# Patient Record
Sex: Female | Born: 2000 | Race: Black or African American | Hispanic: No | Marital: Single | Smoking: Never smoker
Health system: Southern US, Community
[De-identification: ages and names within clinical notes are randomized; demographics above are authoritative.]

## PROBLEM LIST (undated history)

## (undated) VITALS — BP 88/53 | HR 114 | Temp 98.4°F | Resp 16 | Ht 58.66 in | Wt 77.4 lb

## (undated) DIAGNOSIS — F329 Major depressive disorder, single episode, unspecified: Secondary | ICD-10-CM

## (undated) DIAGNOSIS — F909 Attention-deficit hyperactivity disorder, unspecified type: Secondary | ICD-10-CM

## (undated) DIAGNOSIS — O139 Gestational [pregnancy-induced] hypertension without significant proteinuria, unspecified trimester: Secondary | ICD-10-CM

## (undated) DIAGNOSIS — F419 Anxiety disorder, unspecified: Secondary | ICD-10-CM

## (undated) DIAGNOSIS — F32A Depression, unspecified: Secondary | ICD-10-CM

## (undated) HISTORY — DX: Gestational (pregnancy-induced) hypertension without significant proteinuria, unspecified trimester: O13.9

## (undated) HISTORY — PX: WISDOM TOOTH EXTRACTION: SHX21

## (undated) HISTORY — PX: FRACTURE SURGERY: SHX138

---
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
## 2021-06-05 ENCOUNTER — Inpatient Hospital Stay (HOSPITAL_COMMUNITY)
Admission: AD | Admit: 2021-06-05 | Discharge: 2021-06-05 | Disposition: A | Discharge status: Home / Self Care | Payer: No Typology Code available for payment source | Attending: Obstetrics and Gynecology | Admitting: Obstetrics and Gynecology

## 2021-06-05 ENCOUNTER — Other Ambulatory Visit: Payer: Self-pay

## 2021-06-05 ENCOUNTER — Encounter (HOSPITAL_COMMUNITY): Payer: Self-pay | Admitting: Obstetrics and Gynecology

## 2021-06-05 DIAGNOSIS — Z3A12 12 weeks gestation of pregnancy: Secondary | ICD-10-CM | POA: Diagnosis not present

## 2021-06-05 DIAGNOSIS — O26891 Other specified pregnancy related conditions, first trimester: Secondary | ICD-10-CM | POA: Diagnosis not present

## 2021-06-05 DIAGNOSIS — R55 Syncope and collapse: Secondary | ICD-10-CM | POA: Diagnosis not present

## 2021-06-05 DIAGNOSIS — R42 Dizziness and giddiness: Secondary | ICD-10-CM | POA: Diagnosis not present

## 2021-06-05 DIAGNOSIS — O26892 Other specified pregnancy related conditions, second trimester: Secondary | ICD-10-CM | POA: Diagnosis not present

## 2021-06-05 DIAGNOSIS — R102 Pelvic and perineal pain: Secondary | ICD-10-CM | POA: Insufficient documentation

## 2021-06-05 LAB — BASIC METABOLIC PANEL
Anion gap: 7 (ref 5–15)
BUN: 7 mg/dL (ref 6–20)
CO2: 22 mmol/L (ref 22–32)
Calcium: 8.9 mg/dL (ref 8.9–10.3)
Chloride: 105 mmol/L (ref 98–111)
Creatinine, Ser: 0.64 mg/dL (ref 0.44–1.00)
GFR, Estimated: >60 mL/min (ref >60)
Glucose, Bld: 81 mg/dL (ref 70–99)
Potassium: 4 mmol/L (ref 3.5–5.1)
Sodium: 134 mmol/L — ABNORMAL LOW (ref 135–145)

## 2021-06-05 LAB — CBC
HCT: 33.7 % — ABNORMAL LOW (ref 36.0–46.0)
Hemoglobin: 11.8 g/dL — ABNORMAL LOW (ref 12.0–15.0)
MCH: 31.1 pg (ref 26.0–34.0)
MCHC: 35 g/dL (ref 30.0–36.0)
MCV: 88.7 fL (ref 80.0–100.0)
Platelets: 150 10*3/uL (ref 150–400)
RBC: 3.8 MIL/uL — ABNORMAL LOW (ref 3.87–5.11)
RDW: 13.1 % (ref 11.5–15.5)
WBC: 9.6 10*3/uL (ref 4.0–10.5)
nRBC: 0 % (ref 0.0–0.2)

## 2021-06-05 LAB — WET PREP, GENITAL
Clue Cells Wet Prep HPF POC: NONE SEEN
Sperm: NONE SEEN
Trich, Wet Prep: NONE SEEN
Yeast Wet Prep HPF POC: NONE SEEN

## 2021-06-05 LAB — URINALYSIS, ROUTINE W REFLEX MICROSCOPIC
Bilirubin Urine: NEGATIVE
Glucose, UA: NEGATIVE mg/dL
Hgb urine dipstick: NEGATIVE
Ketones, ur: NEGATIVE mg/dL
Leukocytes,Ua: NEGATIVE
Nitrite: NEGATIVE
Protein, ur: NEGATIVE mg/dL
Specific Gravity, Urine: 1.021 (ref 1.005–1.030)
pH: 6 (ref 5.0–8.0)

## 2021-06-05 NOTE — MAU Provider Note (Signed)
History   CSN: [id number]  Arrival date and time: 06/05/21 1354  Chief Complaint  Patient presents with   Abdominal Pain   HPI  Yesenia Clarke is a 20 year old G1P0 at [redacted]w[redacted]d by LMP who presents with pelvic pain/pressure that started earlier this morning. Patient reports it was initially quite intense but has improved over the course of the day. It is located across her lower abdomen. She denies dysuria, hematuria, vaginal bleeding, vaginal discharge, or loss of fluid. She has not had any fever or chills. She does state that she has been getting more dizzy after standing up since becoming pregnant. She states that she will get up to go walking somewhere and suddenly feel like she is going to pass out. She is drinking fluids and eating regularly, at least every 3-4 hours. She has not had associated palpitations, chest pain, or SOB associated with these episodes. She has not yet had her initial prenatal visit yet. She is planning to be seen at [facility]. She presents today for further evaluation.   OB History     Gravida  1   Para      Term      Preterm      AB      Living         SAB      IAB      Ectopic      Multiple      Live Births              Past Medical History:  Diagnosis Date   ADHD (attention deficit hyperactivity disorder)    Anxiety    Depression     Past Surgical History:  Procedure Laterality Date   FRACTURE SURGERY     WISDOM TOOTH EXTRACTION      Family History  Problem Relation Age of Onset   Drug abuse Father    Alcoholism Father    Hypertension Maternal Grandmother     Social History   Tobacco Use   Smoking status: Never   Smokeless tobacco: Never  Vaping Use   Vaping Use: Never used  Substance Use Topics   Alcohol use: No   Drug use: No    Allergies: No Known Allergies  No medications prior to admission.    Review of Systems  All other systems reviewed and are negative. See HPI.  Physical Exam   Blood pressure  112/60, pulse 79, temperature 98.3 F (36.8 C), temperature source Oral, resp. rate 16, height 5\' 5"  (1.651 m), weight 102.4 kg, last menstrual period 02/26/2021, SpO2 100 %.  Physical Exam Exam conducted with a chaperone present.  Constitutional:      General: She is not in acute distress.    Appearance: She is well-developed.  HENT:     Head: Normocephalic and atraumatic.     Mouth/Throat:     Mouth: Mucous membranes are moist.  Eyes:     Extraocular Movements: Extraocular movements intact.  Cardiovascular:     Rate and Rhythm: Normal rate and regular rhythm.     Heart sounds: No murmur heard. Pulmonary:     Effort: Pulmonary effort is normal.     Breath sounds: Normal breath sounds.  Abdominal:     General: Bowel sounds are normal.     Palpations: Abdomen is soft.     Tenderness: There is no abdominal tenderness.  Genitourinary:    Vagina: Vaginal discharge (white colored discharge in vaginal vault) present.  Comments: Cervical exam: 0/0/-3 Neurological:     Mental Status: She is alert.   FHT - 154 bpm  MAU Course   MDM Patient presents to MAU with two complaints today. She reports pelvic pain and pressure that started today. She has not had associated vaginal discharge or dysuria. No vaginal bleeding or LOF.  She additionally reports feeling lightheaded with standing up. This has been going on since the onset of her pregnancy and has not been associated with palpitations, chest pain, or SOB. It improves on its own. She is hemodynamically stable.   A sterile speculum exam was performed and showed white vaginal discharge in the vaginal vault - otherwise unremarkable. Patient's cervix is closed by digital exam. No CMT.   Her urinalysis was normal - OB culture sent.   Wet prep - negative; GC/CT pending.   CBC with Hgb 11.8 - otherwise normal. BMP unremarkable.  EKG obtained and personally reviewed. Normal sinus rhythm without ischemic changes or obvious arrhythmia.  Discussed EKG with Cardiology on call due to concern for possible delta wave. EKG reviewed by Dr. Mayford Knife and confirmed absence of WPW.   1800 - Symptoms improved. No longer complaining of pain/pressure in the pelvic region.  Overall stable for discharge home. Will await pending labs and treat as indicated. Reviewed strict return precautions. Patient and her mother voiced understanding. Encouraged increased hydration due to lightheadedness with positional changes that is likely due to mild orthostasis. Return precautions reviewed should she ever experience true syncope or other associated cardiac symptoms. Both patient and parent voiced understanding.  Assessment and Plan  Clinical impression - Pelvic pressure in pregnancy, second trimester Discharge home to self care   Evalina Field, MD Obstetrics Fellow 06/06/2021, 9:07 PM

## 2021-06-05 NOTE — MAU Note (Signed)
Called Main Lab to check on status of BMP. After being placed on hold, Lab took down unit number and stated they would search for the sample and call back.

## 2021-06-05 NOTE — MAU Note (Addendum)
Been having pain and pressure in lower abd/pelvis. Denies bleeding.  D/c is heavier since pregnant- denies itching or irritation. Preg confirmed at [facility]  early June.- switched care

## 2021-06-05 NOTE — Progress Notes (Signed)
   ECG reviewed by Dr Mayford Knife, no WPW or other acute problem.  Theodore Demark, PA-C 06/05/2021 8:22 PM

## 2021-06-06 ENCOUNTER — Encounter: Payer: Self-pay | Admitting: Obstetrics

## 2021-06-06 LAB — GC/CHLAMYDIA PROBE AMP (~~LOC~~) NOT AT ARMC
Chlamydia: NEGATIVE
Comment: NEGATIVE
Comment: NORMAL
Neisseria Gonorrhea: NEGATIVE

## 2021-06-07 LAB — CULTURE, OB URINE: Culture: <10000 — AB

## 2021-06-11 ENCOUNTER — Telehealth: Payer: Self-pay | Admitting: *Deleted

## 2021-06-11 ENCOUNTER — Ambulatory Visit: Payer: Medicaid Other | Admitting: *Deleted

## 2021-06-11 DIAGNOSIS — Z3A15 15 weeks gestation of pregnancy: Secondary | ICD-10-CM

## 2021-06-18 ENCOUNTER — Other Ambulatory Visit: Payer: Self-pay

## 2021-06-18 ENCOUNTER — Ambulatory Visit (INDEPENDENT_AMBULATORY_CARE_PROVIDER_SITE_OTHER): Payer: Medicaid Other | Admitting: Obstetrics

## 2021-06-18 ENCOUNTER — Encounter: Payer: Self-pay | Admitting: Obstetrics

## 2021-06-18 VITALS — BP 128/71 | HR 98 | Wt 182.0 lb

## 2021-06-18 DIAGNOSIS — Z3480 Encounter for supervision of other normal pregnancy, unspecified trimester: Secondary | ICD-10-CM

## 2021-06-18 MED ORDER — VITAFOL ULTRA 29-0.6-0.4-200 MG PO CAPS
1.0000 | ORAL_CAPSULE | Freq: Every day | ORAL | 4 refills | Status: DC
Start: 2021-06-18 — End: 2022-04-15

## 2021-06-18 NOTE — Telephone Encounter (Signed)
No answer for OB Intake call on 06/11/21.

## 2021-06-18 NOTE — Progress Notes (Signed)
Subjective:    Yesenia Clarke is being seen today for her first obstetrical visit.  This is not a planned pregnancy. She is at [redacted]w[redacted]d gestation. Her obstetrical history is significant for  none . Relationship with FOB: significant other, not living together. Patient does intend to breast feed. Pregnancy history fully reviewed.  The information documented in the HPI was reviewed and verified.  Menstrual History: OB History     Gravida  1   Para      Term      Preterm      AB      Living         SAB      IAB      Ectopic      Multiple      Live Births               Patient's last menstrual period was 02/26/2021.    Past Medical History:  Diagnosis Date   ADHD (attention deficit hyperactivity disorder)    Anxiety    Depression     Past Surgical History:  Procedure Laterality Date   FRACTURE SURGERY     WISDOM TOOTH EXTRACTION      (Not in a hospital admission)  No Known Allergies  Social History   Tobacco Use   Smoking status: Never   Smokeless tobacco: Never  Substance Use Topics   Alcohol use: No    Family History  Problem Relation Age of Onset   Drug abuse Father    Alcoholism Father    Hypertension Maternal Grandmother      Review of Systems Constitutional: negative for weight loss Gastrointestinal: negative for vomiting Genitourinary:negative for genital lesions and vaginal discharge and dysuria Musculoskeletal:negative for back pain Behavioral/Psych: negative for abusive relationship, depression, illegal drug usage and tobacco use    Objective:    BP 128/71   Pulse 98   Wt 182 lb (82.6 kg)   LMP 02/26/2021   BMI 30.29 kg/m  General Appearance:    Alert, cooperative, no distress, appears stated age  Head:    Normocephalic, without obvious abnormality, atraumatic  Eyes:    PERRL, conjunctiva/corneas clear, EOM's intact, fundi    benign, both eyes  Ears:    Normal TM's and external ear canals, both ears  Nose:   Nares normal, septum  midline, mucosa normal, no drainage    or sinus tenderness  Throat:   Lips, mucosa, and tongue normal; teeth and gums normal  Neck:   Supple, symmetrical, trachea midline, no adenopathy;    thyroid:  no enlargement/tenderness/nodules; no carotid   bruit or JVD  Back:     Symmetric, no curvature, ROM normal, no CVA tenderness  Lungs:     Clear to auscultation bilaterally, respirations unlabored  Chest Wall:    No tenderness or deformity   Heart:    Regular rate and rhythm, S1 and S2 normal, no murmur, rub   or gallop  Breast Exam:    No tenderness, masses, or nipple abnormality  Abdomen:     Soft, non-tender, bowel sounds active all four quadrants,    no masses, no organomegaly  Genitalia:    Normal female without lesion, discharge or tenderness  Extremities:   Extremities normal, atraumatic, no cyanosis or edema  Pulses:   2+ and symmetric all extremities  Skin:   Skin color, texture, turgor normal, no rashes or lesions  Lymph nodes:   Cervical, supraclavicular, and axillary nodes normal  Neurologic:  CNII-XII intact, normal strength, sensation and reflexes    throughout      Lab Review Urine pregnancy test Labs reviewed yes Radiologic studies reviewed no  Assessment:    Pregnancy at [redacted]w[redacted]d weeks    Plan:    1. Supervision of other normal pregnancy, antepartum Rx: - [place] MFM OB COMP + 14 WK; Future - Obstetric Panel, Including HIV - Genetic Screening - Prenat-Fe Poly-Methfol-FA-DHA (VITAFOL ULTRA) 29-0.6-0.4-200 MG CAPS; Take 1 capsule by mouth daily before breakfast.  Dispense: 90 capsule; Refill: 4   Prenatal vitamins.  Counseling provided regarding continued use of seat belts, cessation of alcohol consumption, smoking or use of illicit drugs; infection precautions i.e., influenza/TDAP immunizations, toxoplasmosis,CMV, parvovirus, listeria and varicella; workplace safety, exercise during pregnancy; routine dental care, safe medications, sexual activity, hot tubs, saunas,  pools, travel, caffeine use, fish and methlymercury, potential toxins, hair treatments, varicose veins Weight gain recommendations per IOM guidelines reviewed: underweight/BMI< 18.5--> gain 28 - 40 lbs; normal weight/BMI 18.5 - 24.9--> gain 25 - 35 lbs; overweight/BMI 25 - 29.9--> gain 15 - 25 lbs; obese/BMI >30->gain  11 - 20 lbs Problem list reviewed and updated. FIRST/CF mutation testing/NIPT/QUAD SCREEN/fragile X/Ashkenazi Jewish population testing/Spinal muscular atrophy discussed: requested. Role of ultrasound in pregnancy discussed; fetal survey: requested. Amniocentesis discussed: not indicated.  Meds ordered this encounter  Medications   Prenat-Fe Poly-Methfol-FA-DHA (VITAFOL ULTRA) 29-0.6-0.4-200 MG CAPS    Sig: Take 1 capsule by mouth daily before breakfast.    Dispense:  90 capsule    Refill:  4   Orders Placed This Encounter  Procedures   [place] MFM OB COMP + 14 WK    Standing Status:   Future    Standing Expiration Date:   06/18/2022    Order Specific Question:   Reason for Exam (SYMPTOM  OR DIAGNOSIS REQUIRED)    Answer:   growth    Order Specific Question:   Preferred Location    Answer:   [facility]   Obstetric Panel, Including HIV   Genetic Screening    [facility]    Follow up in 4 weeks.  I have spent a total of 20 minutes of face-to-face time, excluding clinical staff time, reviewing notes and preparing to see patient, ordering tests and/or medications, and counseling the patient.    Brock Bad, MD 06/18/2021 10:50 AM

## 2021-06-20 LAB — OBSTETRIC PANEL, INCLUDING HIV
Antibody Screen: NEGATIVE
Basophils Absolute: 0 10*3/uL (ref 0.0–0.2)
Basos: 0 %
EOS (ABSOLUTE): 0 10*3/uL (ref 0.0–0.4)
Eos: 1 %
HIV Screen 4th Generation wRfx: NONREACTIVE
Hematocrit: 34 % (ref 34.0–46.6)
Hemoglobin: 11.7 g/dL (ref 11.1–15.9)
Hepatitis B Surface Ag: NEGATIVE
Immature Grans (Abs): 0.1 10*3/uL (ref 0.0–0.1)
Immature Granulocytes: 1 %
Lymphocytes Absolute: 1.3 10*3/uL (ref 0.7–3.1)
Lymphs: 15 %
MCH: 30.5 pg (ref 26.6–33.0)
MCHC: 34.4 g/dL (ref 31.5–35.7)
MCV: 89 fL (ref 79–97)
Monocytes Absolute: 0.5 10*3/uL (ref 0.1–0.9)
Monocytes: 6 %
Neutrophils Absolute: 6.6 10*3/uL (ref 1.4–7.0)
Neutrophils: 77 %
Platelets: 163 10*3/uL (ref 150–450)
RBC: 3.84 x10E6/uL (ref 3.77–5.28)
RDW: 12.8 % (ref 11.7–15.4)
RPR Ser Ql: NONREACTIVE
Rh Factor: POSITIVE
Rubella Antibodies, IGG: 6.82 index (ref >0.99)
WBC: 8.5 10*3/uL (ref 3.4–10.8)

## 2021-06-21 ENCOUNTER — Inpatient Hospital Stay (HOSPITAL_COMMUNITY)
Admission: AD | Admit: 2021-06-21 | Discharge: 2021-06-21 | Disposition: A | Discharge status: Home / Self Care | Payer: No Typology Code available for payment source | Attending: Obstetrics and Gynecology | Admitting: Obstetrics and Gynecology

## 2021-06-21 ENCOUNTER — Encounter (HOSPITAL_COMMUNITY): Payer: Self-pay | Admitting: Obstetrics and Gynecology

## 2021-06-21 DIAGNOSIS — N949 Unspecified condition associated with female genital organs and menstrual cycle: Secondary | ICD-10-CM | POA: Diagnosis not present

## 2021-06-21 DIAGNOSIS — Z3A16 16 weeks gestation of pregnancy: Secondary | ICD-10-CM | POA: Insufficient documentation

## 2021-06-21 DIAGNOSIS — Z8249 Family history of ischemic heart disease and other diseases of the circulatory system: Secondary | ICD-10-CM | POA: Diagnosis not present

## 2021-06-21 DIAGNOSIS — O26892 Other specified pregnancy related conditions, second trimester: Secondary | ICD-10-CM | POA: Insufficient documentation

## 2021-06-21 DIAGNOSIS — O26899 Other specified pregnancy related conditions, unspecified trimester: Secondary | ICD-10-CM

## 2021-06-21 DIAGNOSIS — R1031 Right lower quadrant pain: Secondary | ICD-10-CM | POA: Diagnosis not present

## 2021-06-21 DIAGNOSIS — R102 Pelvic and perineal pain: Secondary | ICD-10-CM | POA: Diagnosis not present

## 2021-06-21 DIAGNOSIS — R1032 Left lower quadrant pain: Secondary | ICD-10-CM | POA: Insufficient documentation

## 2021-06-21 LAB — URINALYSIS, ROUTINE W REFLEX MICROSCOPIC
Bilirubin Urine: NEGATIVE
Glucose, UA: NEGATIVE mg/dL
Hgb urine dipstick: NEGATIVE
Ketones, ur: NEGATIVE mg/dL
Leukocytes,Ua: NEGATIVE
Nitrite: NEGATIVE
Protein, ur: NEGATIVE mg/dL
Specific Gravity, Urine: 1.006 (ref 1.005–1.030)
pH: 7 (ref 5.0–8.0)

## 2021-06-21 MED ORDER — IBUPROFEN 600 MG PO TABS
600.0000 mg | ORAL_TABLET | Freq: Once | ORAL | Status: AC
  Administered 2021-06-21: 600 mg via ORAL

## 2021-06-21 NOTE — MAU Note (Signed)
Reports she has been stressed today and began cramping, is worried about the baby. No vaginal bleeding.  FHR: 155 Pain score: 8/10

## 2021-06-21 NOTE — MAU Provider Note (Signed)
Chief Complaint:  No chief complaint on file.   Event Date/Time   First Provider Initiated Contact with Patient 06/21/21 0238     HPI: Yesenia Clarke is a 20 y.o. G1P0 at 24w3dwho presents to maternity admissions reporting painful cramping which woke her from sleep  States she has been stressed and wanted to make sure baby is ok. Catalina Pizza RN that pain was more sharp, intermittent on sides of uterus.  She denies LOF, vaginal bleeding, vaginal itching/burning, urinary symptoms, h/a, dizziness, n/v, diarrhea, constipation or fever/chills.  .  Abdominal Pain This is a new problem. The current episode started today. The onset quality is sudden. The problem occurs constantly. The problem is unchanged. The pain is located in the LLQ and RLQ. The quality of the pain is described as sharp and cramping. The pain does not radiate. Associated symptoms include anxiety. Pertinent negatives include no constipation, diarrhea, dysuria, fever, frequency, myalgias or nausea. Nothing relieves the symptoms. Past treatments include nothing.   RN Note: Reports she has been stressed today and began cramping, is worried about the baby. No vaginal bleeding.  FHR: 155 Pain score: 8/10  Past Medical History: Past Medical History:  Diagnosis Date   ADHD (attention deficit hyperactivity disorder)    Anxiety    Depression     Past obstetric history: OB History  Gravida Para Term Preterm AB Living  1            SAB IAB Ectopic Multiple Live Births               # Outcome Date GA Lbr Len/2nd Weight Sex Delivery Anes PTL Lv  1 Current             Past Surgical History: Past Surgical History:  Procedure Laterality Date   FRACTURE SURGERY     WISDOM TOOTH EXTRACTION      Family History: Family History  Problem Relation Age of Onset   Drug abuse Father    Alcoholism Father    Hypertension Maternal Grandmother     Social History: Social History   Tobacco Use   Smoking status: Never   Smokeless tobacco:  Never  Vaping Use   Vaping Use: Never used  Substance Use Topics   Alcohol use: No   Drug use: No    Allergies: No Known Allergies  Meds:  Medications Prior to Admission  Medication Sig Dispense Refill Last Dose   Prenat-Fe Poly-Methfol-FA-DHA (VITAFOL ULTRA) 29-0.6-0.4-200 MG CAPS Take 1 capsule by mouth daily before breakfast. 90 capsule 4    pyridOXINE (VITAMIN B-6) 100 MG tablet Take 100 mg by mouth daily.       I have reviewed patient's Past Medical Hx, Surgical Hx, Family Hx, Social Hx, medications and allergies.   ROS:  Review of Systems  Constitutional:  Negative for fever.  Gastrointestinal:  Positive for abdominal pain. Negative for constipation, diarrhea and nausea.  Genitourinary:  Negative for dysuria and frequency.  Musculoskeletal:  Negative for myalgias.  Psychiatric/Behavioral:  The patient is nervous/anxious.   Other systems negative  Physical Exam  No data found. Constitutional: Well-developed, well-nourished female in no acute distress.  Cardiovascular: normal rate and rhythm Respiratory: normal effort, clear to auscultation bilaterally GI: Abd soft, non-tender, gravid appropriate for gestational age.   No rebound or guarding. MS: Extremities nontender, no edema, normal ROM Neurologic: Alert and oriented x 4.  GU: Neg CVAT.  PELVIC EXAM:  Cervix long and closed FHT:  155   Labs: Results for  orders placed or performed during the hospital encounter of 06/21/21 (from the past 24 hour(s))  Urinalysis, Routine w reflex microscopic     Status: Abnormal   Collection Time: 06/21/21  1:31 AM  Result Value Ref Range   Color, Urine STRAW (A) YELLOW   APPearance CLEAR CLEAR   Specific Gravity, Urine 1.006 1.005 - 1.030   pH 7.0 5.0 - 8.0   Glucose, UA NEGATIVE NEGATIVE mg/dL   Hgb urine dipstick NEGATIVE NEGATIVE   Bilirubin Urine NEGATIVE NEGATIVE   Ketones, ur NEGATIVE NEGATIVE mg/dL   Protein, ur NEGATIVE NEGATIVE mg/dL   Nitrite NEGATIVE NEGATIVE    Leukocytes,Ua NEGATIVE NEGATIVE    B/Positive/-- (08/08 1110)  Imaging:  No results found.  MAU Course/MDM: I have ordered labs and reviewed results. UA is clear Treatments in MAU included ibuprofen which improved pain from an "8" to a "5" Discussed this may be cramping due to stretching/growth or round ligament pain  No indication of Preterm labor or change in cervix.    Assessment: Single IUP at.[redacted]w[redacted]d Pelvic pain, likely round ligament pain  Plan: Discharge home PTL precautions Follow up in Office for prenatal visits Encouraged to return if she develops worsening of symptoms, increase in pain, fever, or other concerning symptoms.   Pt stable at time of discharge.  Wynelle Bourgeois CNM, MSN Certified Nurse-Midwife 06/21/2021 2:38 AM

## 2021-07-02 ENCOUNTER — Encounter: Payer: Self-pay | Admitting: Obstetrics

## 2021-07-04 ENCOUNTER — Encounter: Payer: Self-pay | Admitting: Obstetrics

## 2021-07-10 ENCOUNTER — Encounter: Payer: Self-pay | Admitting: Obstetrics

## 2021-07-12 ENCOUNTER — Ambulatory Visit: Payer: No Typology Code available for payment source | Attending: Obstetrics

## 2021-07-12 ENCOUNTER — Other Ambulatory Visit: Payer: Self-pay

## 2021-07-12 DIAGNOSIS — Z3480 Encounter for supervision of other normal pregnancy, unspecified trimester: Secondary | ICD-10-CM

## 2021-07-17 ENCOUNTER — Encounter: Payer: Self-pay | Admitting: Obstetrics

## 2021-07-17 ENCOUNTER — Other Ambulatory Visit: Payer: Self-pay

## 2021-07-17 ENCOUNTER — Ambulatory Visit (INDEPENDENT_AMBULATORY_CARE_PROVIDER_SITE_OTHER): Payer: Medicaid Other | Admitting: Obstetrics

## 2021-07-17 VITALS — BP 127/78 | HR 107 | Wt 190.2 lb

## 2021-07-17 DIAGNOSIS — Z3480 Encounter for supervision of other normal pregnancy, unspecified trimester: Secondary | ICD-10-CM

## 2021-07-17 NOTE — Progress Notes (Signed)
Subjective:  Yesenia Clarke is a 20 y.o. G1P0 at [redacted]w[redacted]d being seen today for ongoing prenatal care.  She is currently monitored for the following issues for this low-risk pregnancy and has ADHD (attention deficit hyperactivity disorder), combined type and MDD (major depressive disorder), recurrent episode, severe (HCC) on their problem list.  Patient reports no complaints.  Contractions: Not present. Vag. Bleeding: None.  Movement: Present. Denies leaking of fluid.   The following portions of the patient's history were reviewed and updated as appropriate: allergies, current medications, past family history, past medical history, past social history, past surgical history and problem list. Problem list updated.  Objective:   Vitals:   07/17/21 0938  BP: 127/78  Pulse: (!) 107  Weight: 190 lb 3.2 oz (86.3 kg)    Fetal Status:     Movement: Present     General:  Alert, oriented and cooperative. Patient is in no acute distress.  Skin: Skin is warm and dry. No rash noted.   Cardiovascular: Normal heart rate noted  Respiratory: Normal respiratory effort, no problems with respiration noted  Abdomen: Soft, gravid, appropriate for gestational age. Pain/Pressure: Absent     Pelvic:  Cervical exam deferred        Extremities: Normal range of motion.  Edema: None  Mental Status: Normal mood and affect. Normal behavior. Normal judgment and thought content.   Urinalysis:      Assessment and Plan:  Pregnancy: G1P0 at [redacted]w[redacted]d  1. Supervision of other normal pregnancy, antepartum   Preterm labor symptoms and general obstetric precautions including but not limited to vaginal bleeding, contractions, leaking of fluid and fetal movement were reviewed in detail with the patient. Please refer to After Visit Summary for other counseling recommendations.   Return in about 4 weeks (around 08/14/2021) for ROB.   Brock Bad, MD  07/17/21

## 2021-07-17 NOTE — Progress Notes (Signed)
Patient presents for ROB. Patient has no concerns today. Patient declined AFP Open Spina Bifida

## 2021-08-14 ENCOUNTER — Ambulatory Visit (INDEPENDENT_AMBULATORY_CARE_PROVIDER_SITE_OTHER): Payer: Medicaid Other | Admitting: Family Medicine

## 2021-08-14 ENCOUNTER — Encounter: Payer: Self-pay | Admitting: Family Medicine

## 2021-08-14 ENCOUNTER — Other Ambulatory Visit: Payer: Self-pay

## 2021-08-14 VITALS — BP 130/79 | HR 74 | Wt 196.6 lb

## 2021-08-14 DIAGNOSIS — Z3A24 24 weeks gestation of pregnancy: Secondary | ICD-10-CM

## 2021-08-14 DIAGNOSIS — R030 Elevated blood-pressure reading, without diagnosis of hypertension: Secondary | ICD-10-CM

## 2021-08-14 DIAGNOSIS — Z3480 Encounter for supervision of other normal pregnancy, unspecified trimester: Secondary | ICD-10-CM

## 2021-08-14 DIAGNOSIS — Z3400 Encounter for supervision of normal first pregnancy, unspecified trimester: Secondary | ICD-10-CM | POA: Insufficient documentation

## 2021-08-14 DIAGNOSIS — Z3482 Encounter for supervision of other normal pregnancy, second trimester: Secondary | ICD-10-CM

## 2021-08-14 MED ORDER — ASPIRIN 81 MG PO CHEW: 81.0000 mg | CHEWABLE_TABLET | Freq: Every day | ORAL | 5 refills | Status: DC

## 2021-08-14 MED ORDER — BLOOD PRESSURE KIT DEVI: 1.0000 | 0 refills | Status: DC

## 2021-08-14 NOTE — Progress Notes (Signed)
   PRENATAL VISIT NOTE  Subjective:  Yesenia Clarke is a 20 y.o. G1P0 at [redacted]w[redacted]d being seen today for ongoing prenatal care.  She is currently monitored for the following issues for this low-risk pregnancy and has ADHD (attention deficit hyperactivity disorder), combined type; MDD (major depressive disorder), recurrent episode, severe ([place]); and Encounter for supervision of normal first pregnancy on their problem list.  Patient reports no complaints.  Contractions: Not present. Vag. Bleeding: None.  Movement: Present. Denies leaking of fluid.   The following portions of the patient's history were reviewed and updated as appropriate: allergies, current medications, past family history, past medical history, past social history, past surgical history and problem list.   Objective:   Vitals:   08/14/21 1043 08/14/21 1046 08/14/21 1156  BP: 139/78 (!) 142/73 130/79  Pulse: (!) 122 (!) 118 74  Weight: 196 lb 9.6 oz (89.2 kg)      Fetal Status: Fetal Heart Rate (bpm): 145   Movement: Present  General:  Alert, oriented and cooperative. Patient is in no acute distress.  Skin: Skin is warm and dry. No rash noted.   Cardiovascular: Normal heart rate noted.  Respiratory: Normal respiratory effort, no problems with respiration noted.  Abdomen: Soft, gravid, appropriate for gestational age.  Pain/Pressure: Absent.     Pelvic: Cervical exam deferred.        Extremities: Normal range of motion.  Edema: None.  Mental Status: Normal mood and affect. Normal behavior. Normal judgment and thought content.   Assessment and Plan:  Pregnancy: G1P0 at [redacted]w[redacted]d  1. Supervision of other normal pregnancy, antepartum 2. [redacted] weeks gestation of pregnancy Progressing well. No concerns today. FHT within normal limits. Follow up in 4 weeks for next prenatal visit. Will be due for 28 week labs and Tdap at that time.   3. Elevated BP without diagnosis of hypertension Systolic BP elevated this visit. Mild range. No prior hx  HTN. Asymptomatic. Repeat BP normal. Will obtain baseline CBC, CMP, and UPC as below. Will start ASA 81 mg due to nulliparity and sociodemographic characteristics (2 moderate risk factors). Reviewed spectrum of hypertensive disorders in pregnancy and expectations pending further evaluation. Advised patient to check her BP at home and reviewed return precautions. Will plan to follow up next visit.  - CBC - Comp Met (CMET) - Protein / creatinine ratio, urine - aspirin 81 MG chewable tablet; Chew 1 tablet (81 mg total) by mouth daily.  Dispense: 30 tablet; Refill: 5 - Blood Pressure Monitoring (BLOOD PRESSURE KIT) DEVI; 1 kit by Does not apply route once a week.  Dispense: 1 each; Refill: 0 - Babyscripts Schedule Optimization  Preterm labor symptoms and general obstetric precautions including but not limited to vaginal bleeding, contractions, leaking of fluid and fetal movement were reviewed in detail with the patient.  Please refer to After Visit Summary for other counseling recommendations.   Return in about 4 weeks (around 09/11/2021) for follow up [place] visit.  Genia Del, MD

## 2021-08-14 NOTE — Progress Notes (Signed)
Patient presents for ROB. Patient denies having any headaches, visual changes, or swelling.

## 2021-08-15 ENCOUNTER — Other Ambulatory Visit: Payer: Self-pay | Admitting: Family Medicine

## 2021-08-15 DIAGNOSIS — O99012 Anemia complicating pregnancy, second trimester: Secondary | ICD-10-CM

## 2021-08-15 LAB — COMPREHENSIVE METABOLIC PANEL
ALT: 23 IU/L (ref 0–32)
AST: 18 IU/L (ref 0–40)
Albumin/Globulin Ratio: 1.2 (ref 1.2–2.2)
Albumin: 3.7 g/dL — ABNORMAL LOW (ref 3.9–5.0)
Alkaline Phosphatase: 130 IU/L — ABNORMAL HIGH (ref 42–106)
BUN/Creatinine Ratio: 9 (ref 9–23)
BUN: 6 mg/dL (ref 6–20)
Bilirubin Total: 0.4 mg/dL (ref 0.0–1.2)
CO2: 18 mmol/L — ABNORMAL LOW (ref 20–29)
Calcium: 9.5 mg/dL (ref 8.7–10.2)
Chloride: 106 mmol/L (ref 96–106)
Creatinine, Ser: 0.67 mg/dL (ref 0.57–1.00)
Globulin, Total: 3.1 g/dL (ref 1.5–4.5)
Glucose: 86 mg/dL (ref 70–99)
Potassium: 4.1 mmol/L (ref 3.5–5.2)
Sodium: 141 mmol/L (ref 134–144)
Total Protein: 6.8 g/dL (ref 6.0–8.5)
eGFR: 129 mL/min/{1.73_m2} (ref >59)

## 2021-08-15 LAB — CBC
Hematocrit: 31.8 % — ABNORMAL LOW (ref 34.0–46.6)
Hemoglobin: 11 g/dL — ABNORMAL LOW (ref 11.1–15.9)
MCH: 30.1 pg (ref 26.6–33.0)
MCHC: 34.6 g/dL (ref 31.5–35.7)
MCV: 87 fL (ref 79–97)
Platelets: 180 10*3/uL (ref 150–450)
RBC: 3.65 x10E6/uL — ABNORMAL LOW (ref 3.77–5.28)
RDW: 12.3 % (ref 11.7–15.4)
WBC: 7.8 10*3/uL (ref 3.4–10.8)

## 2021-08-15 LAB — PROTEIN / CREATININE RATIO, URINE
Creatinine, Urine: 83.8 mg/dL
Protein, Ur: 9.7 mg/dL
Protein/Creat Ratio: 116 mg/g creat (ref 0–200)

## 2021-08-15 MED ORDER — IRON (FERROUS SULFATE) 325 (65 FE) MG PO TABS: 1.0000 | ORAL_TABLET | ORAL | 5 refills | Status: DC

## 2021-09-05 ENCOUNTER — Inpatient Hospital Stay (HOSPITAL_COMMUNITY)
Admission: AD | Admit: 2021-09-05 | Discharge: 2021-09-05 | Disposition: A | Discharge status: Home / Self Care | Payer: Medicaid Other | Attending: Obstetrics and Gynecology | Admitting: Obstetrics and Gynecology

## 2021-09-05 ENCOUNTER — Encounter (HOSPITAL_COMMUNITY): Payer: Self-pay | Admitting: Obstetrics and Gynecology

## 2021-09-05 ENCOUNTER — Other Ambulatory Visit: Payer: Self-pay

## 2021-09-05 DIAGNOSIS — Z7982 Long term (current) use of aspirin: Secondary | ICD-10-CM | POA: Insufficient documentation

## 2021-09-05 DIAGNOSIS — B9689 Other specified bacterial agents as the cause of diseases classified elsewhere: Secondary | ICD-10-CM

## 2021-09-05 DIAGNOSIS — Z3A27 27 weeks gestation of pregnancy: Secondary | ICD-10-CM | POA: Diagnosis not present

## 2021-09-05 DIAGNOSIS — O368120 Decreased fetal movements, second trimester, not applicable or unspecified: Secondary | ICD-10-CM | POA: Diagnosis not present

## 2021-09-05 DIAGNOSIS — Z3689 Encounter for other specified antenatal screening: Secondary | ICD-10-CM

## 2021-09-05 DIAGNOSIS — O23592 Infection of other part of genital tract in pregnancy, second trimester: Secondary | ICD-10-CM | POA: Insufficient documentation

## 2021-09-05 DIAGNOSIS — O26892 Other specified pregnancy related conditions, second trimester: Secondary | ICD-10-CM | POA: Diagnosis not present

## 2021-09-05 DIAGNOSIS — N760 Acute vaginitis: Secondary | ICD-10-CM

## 2021-09-05 LAB — WET PREP, GENITAL
Sperm: NONE SEEN
Trich, Wet Prep: NONE SEEN
Yeast Wet Prep HPF POC: NONE SEEN

## 2021-09-05 LAB — URINALYSIS, ROUTINE W REFLEX MICROSCOPIC
Bilirubin Urine: NEGATIVE
Glucose, UA: NEGATIVE mg/dL
Hgb urine dipstick: NEGATIVE
Ketones, ur: NEGATIVE mg/dL
Leukocytes,Ua: NEGATIVE
Nitrite: NEGATIVE
Protein, ur: NEGATIVE mg/dL
Specific Gravity, Urine: 1.011 (ref 1.005–1.030)
pH: 6 (ref 5.0–8.0)

## 2021-09-05 MED ORDER — METRONIDAZOLE 500 MG PO TABS: 500.0000 mg | ORAL_TABLET | Freq: Two times a day (BID) | ORAL | 0 refills | Status: DC

## 2021-09-05 NOTE — MAU Provider Note (Signed)
Patient Yesenia Clarke is a 20 y.o. G1P0  At 94w2dhere with complaints of vaginal bleeding that happened at 1:30 pm (this is when she got up and when to the bathroom, it could have happened earlier in the day). It was the size of a silver doller and it bled through her underwear.  History     CSN: [id number] Arrival date and time: 09/05/21 1349   Event Date/Time   First Provider Initiated Contact with Patient 09/05/21 1433      Chief Complaint  Patient presents with   Vaginal Bleeding   Decreased Fetal Movement   Vaginal Bleeding The patient's primary symptoms include vaginal bleeding. This is a new problem. The current episode started today. The problem has been resolved. The patient is experiencing no pain. Pertinent negatives include no constipation, diarrhea, dysuria or fever. The vaginal discharge was bloody. The vaginal bleeding is spotting. She has not been passing clots. She has not been passing tissue.   OB History     Gravida  1   Para      Term      Preterm      AB      Living         SAB      IAB      Ectopic      Multiple      Live Births              Past Medical History:  Diagnosis Date   ADHD (attention deficit hyperactivity disorder)    Anxiety    Depression     Past Surgical History:  Procedure Laterality Date   FRACTURE SURGERY     WISDOM TOOTH EXTRACTION      Family History  Problem Relation Age of Onset   Drug abuse Father    Alcoholism Father    Hypertension Maternal Grandmother     Social History   Tobacco Use   Smoking status: Never   Smokeless tobacco: Never  Vaping Use   Vaping Use: Never used  Substance Use Topics   Alcohol use: No   Drug use: No    Allergies: No Known Allergies  Medications Prior to Admission  Medication Sig Dispense Refill Last Dose   aspirin 81 MG chewable tablet Chew 1 tablet (81 mg total) by mouth daily. 30 tablet 5 09/04/2021   Iron, Ferrous Sulfate, 325 (65 Fe) MG TABS Take 1  tablet by mouth every other day. 30 tablet 5 09/04/2021   Prenat-Fe Poly-Methfol-FA-DHA (VITAFOL ULTRA) 29-0.6-0.4-200 MG CAPS Take 1 capsule by mouth daily before breakfast. 90 capsule 4 09/04/2021   Blood Pressure Monitoring (BLOOD PRESSURE KIT) DEVI 1 kit by Does not apply route once a week. 1 each 0    cetirizine (ZYRTEC) 10 MG tablet Take by mouth.      pyridOXINE (VITAMIN B-6) 100 MG tablet Take 100 mg by mouth daily. (Patient not taking: Reported on 08/14/2021)       Review of Systems  Constitutional:  Negative for fever.  HENT: Negative.    Gastrointestinal:  Negative for constipation and diarrhea.  Genitourinary:  Positive for vaginal bleeding. Negative for dysuria.  Neurological: Negative.   Psychiatric/Behavioral: Negative.    Physical Exam   Blood pressure 127/72, pulse (!) 102, temperature 98.6 F (37 C), temperature source Oral, resp. rate 14, last menstrual period 02/26/2021, SpO2 96 %.  Physical Exam Constitutional:      Appearance: Normal appearance.  HENT:  Head: Normocephalic.  Cardiovascular:     Rate and Rhythm: Normal rate.  Pulmonary:     Effort: Pulmonary effort is normal.  Abdominal:     General: Abdomen is flat.  Genitourinary:    General: Normal vulva.     Vagina: No vaginal discharge.     Comments: NEFG; no discharge in the vagina, no bleeding after exam, patient is long, closed, posterior, no CMT or suprapubic tenderness. On left labia there is a swollen area the size of a pencil eraser; it does not appear to be a blister.  Skin:    General: Skin is warm.  Neurological:     General: No focal deficit present.     Mental Status: She is alert.    MAU Course  Procedures  MDM -patient felt strong fetal movements while in MAU NST: 140 bpm, mod var, present acels, no decels, no contractions Patient had no active bleeding while in MAU Wet prep: positive for clue -Reviewed [place] notes from scan on 07/12/2021; no placenta previa.  -Blood Type is B  pos -Patient denies any itching, pain, blistering on her labia.  Assessment and Plan   1. Bacterial vaginosis   2. NST (non-stress test) reactive    -patient stable for discharge with RX for Flagyl -keep appt next week at [facility]  -reviewed strict return precautions, as well as normalcy of bleeding in pregnancy due to increased blood flow to uterus and cervix; if patient has any further bleeding she can return to [place] 09/05/2021, 3:19 PM

## 2021-09-05 NOTE — MAU Note (Signed)
.  Yesenia Clarke is a 20 y.o. at [redacted]w[redacted]d here in MAU reporting: around 1330 she notice bright red blood in her underwear. States that she is no longer bleeding. Has not felt the baby move since this morning. Denies LOF.   Pain score: 0 Vitals:   09/05/21 1407  BP: 120/65  Pulse: (!) 105  Resp: 14  SpO2: 96%     FHT:145 Lab orders placed from triage:  UA

## 2021-09-06 LAB — GC/CHLAMYDIA PROBE AMP (~~LOC~~) NOT AT ARMC
Chlamydia: NEGATIVE
Comment: NEGATIVE
Comment: NORMAL
Neisseria Gonorrhea: NEGATIVE

## 2021-09-11 ENCOUNTER — Other Ambulatory Visit: Payer: Self-pay

## 2021-09-11 ENCOUNTER — Ambulatory Visit (INDEPENDENT_AMBULATORY_CARE_PROVIDER_SITE_OTHER): Payer: Medicaid Other

## 2021-09-11 ENCOUNTER — Other Ambulatory Visit: Payer: Medicaid Other

## 2021-09-11 VITALS — BP 109/63 | HR 124 | Wt 198.0 lb

## 2021-09-11 DIAGNOSIS — Z3A28 28 weeks gestation of pregnancy: Secondary | ICD-10-CM

## 2021-09-11 DIAGNOSIS — O99210 Obesity complicating pregnancy, unspecified trimester: Secondary | ICD-10-CM

## 2021-09-11 DIAGNOSIS — O133 Gestational [pregnancy-induced] hypertension without significant proteinuria, third trimester: Secondary | ICD-10-CM

## 2021-09-11 DIAGNOSIS — Z3400 Encounter for supervision of normal first pregnancy, unspecified trimester: Secondary | ICD-10-CM

## 2021-09-11 NOTE — Progress Notes (Signed)
ROB/GTT. Declined FLU and TDAP vaccines.  C/o bleeding last week, pain, pressure.

## 2021-09-11 NOTE — Progress Notes (Signed)
LOW-RISK PREGNANCY OFFICE VISIT  Patient name: Yesenia Clarke MRN [id number]  Date of birth: Jun 12, 2001 Chief Complaint:   Routine Prenatal Visit  Subjective:   Yesenia Clarke is a 20 y.o. G1P0 female at [redacted]w[redacted]d with an Estimated Date of Delivery: 12/03/21 being seen today for ongoing management of a low-risk pregnancy aeb has ADHD (attention deficit hyperactivity disorder), combined type; MDD (major depressive disorder), recurrent episode, severe (HCC); and Encounter for supervision of normal first pregnancy on their problem list.  Patient presents today with no complaints. She reports bleeding and pressure last week and was evaluated in MAU.  She reports she had pressure last night that "lasted for awhile."  She denies attempting any interventions. Patient endorses fetal movement. Patient denies abdominal cramping or contractions.  Patient denies vaginal concerns including abnormal discharge, leaking of fluid, and bleeding.  Contractions: Not present. Vag. Bleeding: None.  Movement: Present.  Reviewed past medical,surgical, social, obstetrical and family history as well as problem list, medications and allergies.  Objective   Vitals:   09/11/21 0907  BP: 109/63  Pulse: (!) 124  Weight: 198 lb (89.8 kg)  Body mass index is 32.95 kg/m.  Total Weight Gain:33 lb (15 kg)         Physical Examination:   General appearance: Well appearing, and in no distress  Mental status: Alert, oriented to person, place, and time  Skin: Warm & dry  Cardiovascular: Normal heart rate noted  Respiratory: Normal respiratory effort, no distress  Abdomen: Soft, gravid, nontender, AGA with Fundal Height: 28 cm  Pelvic: Cervical exam deferred           Extremities: Edema: None  Fetal Status: Fetal Heart Rate (bpm): 148  Movement: Present   No results found for this or any previous visit (from the past 24 hour(s)).  Assessment & Plan:  Low-risk pregnancy of a 20 y.o., G1P0 at [redacted]w[redacted]d with an Estimated Date of  Delivery: 12/03/21   1. Supervision of normal first pregnancy, antepartum -Anticipatory guidance for upcoming appts. -Patient to schedule next appt in 2 weeks for an in-person visit. -Reviewed Glucola testing for today. -Reviewed blood draw procedures and labs which also include check of iron/HgB level, RPR, and HIV *Informed that repeat RPR/HIV are for pediatric records/compliance.  -Discussed how results of GTT are handled including diabetic education and BS testing for abnormal results and routine care for normal results.   2. [redacted] weeks gestation of pregnancy -Doing well -Reassured that vaginal bleeding likely from BV diagnosis. Meds completed. -Encouraged usage of maternity belt/band  3. Gestational hypertension, third trimester -Mother reports patient has had several elevated bp at home, but does not always record.  -Reviewed previous BP and notes:  *10/4: 142/73 *10/12: 134/95 Baby scripts -Informed patient and mother of criteria for and diagnosis of based on findings. -Discussed management and recommendation for 37 week induction. -Will collect baseline labs today: CMP and PC Ratio.  4. Obesity in pregnancy, antepartum -TWG 33lbs! -Discussed healthy eating choices  -Encouraged high protein snacks. -Order for 32 week growth [place] placed.     Meds: No orders of the defined types were placed in this encounter.  Labs/procedures today:  Lab Orders  No laboratory test(s) ordered today     Reviewed: Preterm labor symptoms and general obstetric precautions including but not limited to vaginal bleeding, contractions, leaking of fluid and fetal movement were reviewed in detail with the patient.  All questions were answered.  Follow-up: No follow-ups on file.  No orders of  the defined types were placed in this encounter.  Cherre Robins MSN, CNM 09/11/2021

## 2021-09-12 LAB — CBC
Hematocrit: 31.5 % — ABNORMAL LOW (ref 34.0–46.6)
Hemoglobin: 11 g/dL — ABNORMAL LOW (ref 11.1–15.9)
MCH: 30.2 pg (ref 26.6–33.0)
MCHC: 34.9 g/dL (ref 31.5–35.7)
MCV: 87 fL (ref 79–97)
Platelets: 199 10*3/uL (ref 150–450)
RBC: 3.64 x10E6/uL — ABNORMAL LOW (ref 3.77–5.28)
RDW: 11.9 % (ref 11.7–15.4)
WBC: 9.4 10*3/uL (ref 3.4–10.8)

## 2021-09-12 LAB — PROTEIN / CREATININE RATIO, URINE
Creatinine, Urine: 194.7 mg/dL
Protein, Ur: 22.1 mg/dL
Protein/Creat Ratio: 114 mg/g creat (ref 0–200)

## 2021-09-12 LAB — COMPREHENSIVE METABOLIC PANEL
ALT: 14 IU/L (ref 0–32)
AST: 15 IU/L (ref 0–40)
Albumin/Globulin Ratio: 1.3 (ref 1.2–2.2)
Albumin: 3.7 g/dL — ABNORMAL LOW (ref 3.9–5.0)
Alkaline Phosphatase: 144 IU/L — ABNORMAL HIGH (ref 42–106)
BUN/Creatinine Ratio: 13 (ref 9–23)
BUN: 8 mg/dL (ref 6–20)
Bilirubin Total: 0.4 mg/dL (ref 0.0–1.2)
CO2: 20 mmol/L (ref 20–29)
Calcium: 9.2 mg/dL (ref 8.7–10.2)
Chloride: 104 mmol/L (ref 96–106)
Creatinine, Ser: 0.6 mg/dL (ref 0.57–1.00)
Globulin, Total: 2.9 g/dL (ref 1.5–4.5)
Glucose: 88 mg/dL (ref 70–99)
Potassium: 4.2 mmol/L (ref 3.5–5.2)
Sodium: 138 mmol/L (ref 134–144)
Total Protein: 6.6 g/dL (ref 6.0–8.5)
eGFR: 133 mL/min/{1.73_m2} (ref >59)

## 2021-09-12 LAB — RPR: RPR Ser Ql: NONREACTIVE

## 2021-09-12 LAB — GLUCOSE TOLERANCE, 2 HOURS W/ 1HR
Glucose, 1 hour: 107 mg/dL (ref 70–179)
Glucose, 2 hour: 99 mg/dL (ref 70–152)
Glucose, Fasting: 89 mg/dL (ref 70–91)

## 2021-09-12 LAB — HIV ANTIBODY (ROUTINE TESTING W REFLEX): HIV Screen 4th Generation wRfx: NONREACTIVE

## 2021-09-25 ENCOUNTER — Other Ambulatory Visit: Payer: Self-pay

## 2021-09-25 ENCOUNTER — Encounter: Payer: Self-pay | Admitting: Obstetrics and Gynecology

## 2021-09-25 ENCOUNTER — Ambulatory Visit (INDEPENDENT_AMBULATORY_CARE_PROVIDER_SITE_OTHER): Payer: Medicaid Other | Admitting: Obstetrics and Gynecology

## 2021-09-25 VITALS — BP 117/74 | HR 116 | Wt 202.0 lb

## 2021-09-25 DIAGNOSIS — O133 Gestational [pregnancy-induced] hypertension without significant proteinuria, third trimester: Secondary | ICD-10-CM

## 2021-09-25 DIAGNOSIS — Z3403 Encounter for supervision of normal first pregnancy, third trimester: Secondary | ICD-10-CM

## 2021-09-25 DIAGNOSIS — O99210 Obesity complicating pregnancy, unspecified trimester: Secondary | ICD-10-CM

## 2021-09-25 DIAGNOSIS — Z3A30 30 weeks gestation of pregnancy: Secondary | ICD-10-CM

## 2021-09-25 MED ORDER — PRENATAL 27-1 MG PO TABS: 1.0000 | ORAL_TABLET | Freq: Every day | ORAL | 11 refills | Status: DC

## 2021-09-25 NOTE — Patient Instructions (Signed)
Use the following websites (and others) to help learn more about your contraception options and find the method that is right for you!  - The Centers for Disease Control (CDC) website: https://www.cdc.gov/reproductivehealth/contraception/index.htm  - [facility] website: https://www.[facility]renthood.org/learn/birth-control  - Bedsider.org: https://www.bedsider.org/methods   Go to bedsider.org for more information!  Contraception Choices Contraception, also called birth control, refers to methods or devices that prevent pregnancy. Hormonal methods Contraceptive implant A contraceptive implant is a thin, plastic tube that contains a hormone. It is inserted into the upper part of the arm. It can remain in place for up to 3 years. Progestin-only injections Progestin-only injections are injections of progestin, a synthetic form of the hormone progesterone. They are given every 3 months by a health care provider. Birth control pills Birth control pills are pills that contain hormones that prevent pregnancy. They must be taken once a day, preferably at the same time each day. Birth control patch The birth control patch contains hormones that prevent pregnancy. It is placed on the skin and must be changed once a week for three weeks and removed on the fourth week. A prescription is needed to use this method of contraception. Vaginal ring A vaginal ring contains hormones that prevent pregnancy. It is placed in the vagina for three weeks and removed on the fourth week. After that, the process is repeated with a new ring. A prescription is needed to use this method of contraception. Emergency contraceptive Emergency contraceptives prevent pregnancy after unprotected sex. They come in pill form and can be taken up to 5 days after sex. They work best the sooner they are taken after having sex. Most emergency contraceptives are available without a prescription. This method should not be used as  your only form of birth control. Barrier methods Female condom A female condom is a thin sheath that is worn over the penis during sex. Condoms keep sperm from going inside a woman's body. They can be used with a spermicide to increase their effectiveness. They should be disposed after a single use. Female condom A female condom is a soft, loose-fitting sheath that is put into the vagina before sex. The condom keeps sperm from going inside a woman's body. They should be disposed after a single use.  Intrauterine contraception Intrauterine device (IUD) An IUD is a T-shaped device that is put in a woman's uterus. There are two types:  Hormone IUD.This type contains progestin, a synthetic form of the hormone progesterone. This type can stay in place for 3-5 years.  Copper IUD.This type is wrapped in copper wire. It can stay in place for 10 years.  Permanent methods of contraception Female tubal ligation In this method, a woman's fallopian tubes are sealed, tied, or blocked during surgery to prevent eggs from traveling to the uterus.  Female sterilization This is a procedure to tie off the tubes that carry sperm (vasectomy). After the procedure, the man can still ejaculate fluid (semen).  Summary  Contraception, also called birth control, means methods or devices that prevent pregnancy.  Hormonal methods of contraception include implants, injections, pills, patches, vaginal rings, and emergency contraceptives.  Barrier methods of contraception can include female condoms, female condoms, diaphragms, cervical caps, sponges, and spermicides.  There are two types of IUDs (intrauterine devices). An IUD can be put in a woman's uterus to prevent pregnancy for 3-5 years.  Permanent sterilization can be done through a procedure for males, females, or both. This information is not intended to replace advice given to you   by your health care provider. Make sure you discuss any questions you have with your  health care provider. Document Released: 10/28/2005 Document Revised: 11/30/2016 Document Reviewed: 11/30/2016 Elsevier Interactive Patient Education  2018 Elsevier Inc.  

## 2021-09-25 NOTE — Progress Notes (Signed)
    PRENATAL VISIT NOTE  Subjective:  Yesenia Clarke is a 20 y.o. G1P0 at [redacted]w[redacted]d being seen today for ongoing prenatal care.  She is currently monitored for the following issues for this high-risk pregnancy and has ADHD (attention deficit hyperactivity disorder), combined type; MDD (major depressive disorder), recurrent episode, severe (HCC); Encounter for supervision of normal first pregnancy; Gestational hypertension, third trimester; and Obesity in pregnancy, antepartum on their problem list.  Patient reports no complaints.  Contractions: Not present. Vag. Bleeding: None.  Movement: Present. Denies leaking of fluid.   The following portions of the patient's history were reviewed and updated as appropriate: allergies, current medications, past family history, past medical history, past social history, past surgical history and problem list.   Objective:   Vitals:   09/25/21 0835  BP: 117/74  Pulse: (!) 116  Weight: 202 lb (91.6 kg)    Fetal Status: Fetal Heart Rate (bpm): 145   Movement: Present     General:  Alert, oriented and cooperative. Patient is in no acute distress.  Skin: Skin is warm and dry. No rash noted.   Cardiovascular: Normal heart rate noted  Respiratory: Normal respiratory effort, no problems with respiration noted  Abdomen: Soft, gravid, appropriate for gestational age.  Pain/Pressure: Absent     Pelvic: Cervical exam deferred        Extremities: Normal range of motion.     Mental Status: Normal mood and affect. Normal behavior. Normal judgment and thought content.   Assessment and Plan:  Pregnancy: G1P0 at [redacted]w[redacted]d  1. Gestational hypertension, third trimester Cont baby aspirin BP normotensive today [place] scheduled for 11/29  2. Encounter for supervision of normal first pregnancy in third trimester  3. Obesity in pregnancy, antepartum  4. [redacted] weeks gestation of pregnancy   Preterm labor symptoms and general obstetric precautions including but not limited to  vaginal bleeding, contractions, leaking of fluid and fetal movement were reviewed in detail with the patient. Please refer to After Visit Summary for other counseling recommendations.   Return in about 2 weeks (around 10/09/2021) for high OB, in person.  Future Appointments  Date Time Provider Department Center  10/09/2021  9:30 AM [facility] NURSE [facility]  10/09/2021  9:45 AM [facility]6 [facility]    Conan Bowens, MD

## 2021-10-09 ENCOUNTER — Encounter: Payer: Medicaid Other | Admitting: Obstetrics and Gynecology

## 2021-10-09 ENCOUNTER — Ambulatory Visit: Payer: Medicaid Other | Admitting: *Deleted

## 2021-10-09 ENCOUNTER — Other Ambulatory Visit: Payer: Self-pay | Admitting: *Deleted

## 2021-10-09 ENCOUNTER — Encounter: Payer: Self-pay | Admitting: *Deleted

## 2021-10-09 ENCOUNTER — Ambulatory Visit: Payer: Medicaid Other

## 2021-10-09 ENCOUNTER — Other Ambulatory Visit: Payer: Self-pay

## 2021-10-09 VITALS — BP 134/75 | HR 119

## 2021-10-09 DIAGNOSIS — O133 Gestational [pregnancy-induced] hypertension without significant proteinuria, third trimester: Secondary | ICD-10-CM

## 2021-10-09 DIAGNOSIS — O99210 Obesity complicating pregnancy, unspecified trimester: Secondary | ICD-10-CM

## 2021-10-09 DIAGNOSIS — Z362 Encounter for other antenatal screening follow-up: Secondary | ICD-10-CM | POA: Insufficient documentation

## 2021-10-09 DIAGNOSIS — Z6833 Body mass index (BMI) 33.0-33.9, adult: Secondary | ICD-10-CM

## 2021-10-09 DIAGNOSIS — Z3A32 32 weeks gestation of pregnancy: Secondary | ICD-10-CM | POA: Insufficient documentation

## 2021-10-09 DIAGNOSIS — Z3400 Encounter for supervision of normal first pregnancy, unspecified trimester: Secondary | ICD-10-CM | POA: Diagnosis present

## 2021-10-17 ENCOUNTER — Ambulatory Visit (INDEPENDENT_AMBULATORY_CARE_PROVIDER_SITE_OTHER): Payer: Medicaid Other | Admitting: Obstetrics & Gynecology

## 2021-10-17 ENCOUNTER — Other Ambulatory Visit (HOSPITAL_COMMUNITY)
Admission: RE | Admit: 2021-10-17 | Discharge: 2021-10-17 | Disposition: A | Discharge status: Home / Self Care | Payer: Medicaid Other | Source: Ambulatory Visit | Attending: Obstetrics and Gynecology | Admitting: Obstetrics and Gynecology

## 2021-10-17 ENCOUNTER — Other Ambulatory Visit: Payer: Self-pay

## 2021-10-17 ENCOUNTER — Encounter: Payer: Self-pay | Admitting: Obstetrics & Gynecology

## 2021-10-17 VITALS — BP 121/77 | HR 105 | Wt 206.0 lb

## 2021-10-17 DIAGNOSIS — Z3403 Encounter for supervision of normal first pregnancy, third trimester: Secondary | ICD-10-CM

## 2021-10-17 DIAGNOSIS — N898 Other specified noninflammatory disorders of vagina: Secondary | ICD-10-CM | POA: Diagnosis present

## 2021-10-17 DIAGNOSIS — O133 Gestational [pregnancy-induced] hypertension without significant proteinuria, third trimester: Secondary | ICD-10-CM

## 2021-10-17 NOTE — Progress Notes (Signed)
   PRENATAL VISIT NOTE  Subjective:  Arlen Legendre is a 20 y.o. G1P0 at [redacted]w[redacted]d being seen today for ongoing prenatal care.  She is currently monitored for the following issues for this high-risk pregnancy and has ADHD (attention deficit hyperactivity disorder), combined type; MDD (major depressive disorder), recurrent episode, severe (HCC); Encounter for supervision of normal first pregnancy; Gestational hypertension, third trimester; and Obesity in pregnancy, antepartum on their problem list.  Patient reports vaginal discharge.  Contractions: Irritability. Vag. Bleeding: None.  Movement: Present. Denies leaking of fluid.   The following portions of the patient's history were reviewed and updated as appropriate: allergies, current medications, past family history, past medical history, past social history, past surgical history and problem list.   Objective:   Vitals:   10/17/21 1044  BP: 121/77  Pulse: (!) 105  Weight: 206 lb (93.4 kg)    Fetal Status: Fetal Heart Rate (bpm): 140   Movement: Present     General:  Alert, oriented and cooperative. Patient is in no acute distress.  Skin: Skin is warm and dry. No rash noted.   Cardiovascular: Normal heart rate noted  Respiratory: Normal respiratory effort, no problems with respiration noted  Abdomen: Soft, gravid, appropriate for gestational age.  Pain/Pressure: Present     Pelvic: Cervical exam performed in the presence of a chaperone      white discharge no pooling  Extremities: Normal range of motion.  Edema: None  Mental Status: Normal mood and affect. Normal behavior. Normal judgment and thought content.   Assessment and Plan:  Pregnancy: G1P0 at [redacted]w[redacted]d 1. Encounter for supervision of normal first pregnancy in third trimester Normal growth. BP nl range today  2. Vaginal discharge Testing performed - Cervicovaginal ancillary only( [place])  3. Gestational hypertension, third trimester F/u BPP weekly  Preterm labor symptoms  and general obstetric precautions including but not limited to vaginal bleeding, contractions, leaking of fluid and fetal movement were reviewed in detail with the patient. Please refer to After Visit Summary for other counseling recommendations.   Return in about 2 weeks (around 10/31/2021).  Future Appointments  Date Time Provider Department Center  10/18/2021 11:15 AM [facility] NURSE [facility]  10/18/2021 11:30 AM [facility]  10/24/2021  1:15 PM [facility]ST [facility]  10/31/2021  1:15 PM [facility]  11/07/2021  2:30 PM [facility] NURSE [facility]  11/07/2021  2:45 PM [facility] US5 [facility]    Scheryl Darter, MD

## 2021-10-17 NOTE — Progress Notes (Signed)
ROB [redacted]w[redacted]d  CC: None    

## 2021-10-18 ENCOUNTER — Ambulatory Visit (HOSPITAL_BASED_OUTPATIENT_CLINIC_OR_DEPARTMENT_OTHER): Payer: Medicaid Other | Admitting: *Deleted

## 2021-10-18 ENCOUNTER — Other Ambulatory Visit: Payer: Self-pay | Admitting: Obstetrics

## 2021-10-18 ENCOUNTER — Ambulatory Visit: Payer: Medicaid Other | Attending: Obstetrics and Gynecology

## 2021-10-18 ENCOUNTER — Encounter: Payer: Self-pay | Admitting: *Deleted

## 2021-10-18 ENCOUNTER — Ambulatory Visit: Payer: Medicaid Other | Admitting: *Deleted

## 2021-10-18 VITALS — BP 123/76 | HR 108

## 2021-10-18 DIAGNOSIS — O283 Abnormal ultrasonic finding on antenatal screening of mother: Secondary | ICD-10-CM | POA: Insufficient documentation

## 2021-10-18 DIAGNOSIS — O133 Gestational [pregnancy-induced] hypertension without significant proteinuria, third trimester: Secondary | ICD-10-CM | POA: Diagnosis present

## 2021-10-18 DIAGNOSIS — Z3A33 33 weeks gestation of pregnancy: Secondary | ICD-10-CM | POA: Diagnosis not present

## 2021-10-18 LAB — CERVICOVAGINAL ANCILLARY ONLY
Bacterial Vaginitis (gardnerella): NEGATIVE
Candida Glabrata: NEGATIVE
Candida Vaginitis: NEGATIVE
Chlamydia: NEGATIVE
Comment: NEGATIVE
Comment: NEGATIVE
Comment: NEGATIVE
Comment: NEGATIVE
Comment: NEGATIVE
Comment: NORMAL
Neisseria Gonorrhea: NEGATIVE
Trichomonas: NEGATIVE

## 2021-10-18 NOTE — Procedures (Signed)
Yesenia Clarke 2001/02/05 [redacted]w[redacted]d  Fetus A Non-Stress Test Interpretation for 10/18/21  Indication: Unsatisfactory BPP  Fetal Heart Rate A Mode: External Baseline Rate (A): 135 bpm Variability: Moderate Accelerations: 15 x 15 Decelerations: None Multiple birth?: No  Uterine Activity Mode: Palpation, Toco Contraction Frequency (min): 1 uc with ui Contraction Duration (sec): 50 Contraction Quality: Mild Resting Tone Palpated: Relaxed Resting Time: Adequate  Interpretation (Fetal Testing) Nonstress Test Interpretation: Reactive Overall Impression: Reassuring for gestational age Comments: Dr. Grace Bushy reviewed tracing

## 2021-10-21 ENCOUNTER — Inpatient Hospital Stay (HOSPITAL_COMMUNITY)
Admission: AD | Admit: 2021-10-21 | Discharge: 2021-10-22 | Disposition: A | Discharge status: Home / Self Care | Payer: Medicaid Other | Attending: Obstetrics & Gynecology | Admitting: Obstetrics & Gynecology

## 2021-10-21 ENCOUNTER — Encounter (HOSPITAL_COMMUNITY): Payer: Self-pay | Admitting: Obstetrics & Gynecology

## 2021-10-21 ENCOUNTER — Other Ambulatory Visit: Payer: Self-pay

## 2021-10-21 DIAGNOSIS — Z3A34 34 weeks gestation of pregnancy: Secondary | ICD-10-CM | POA: Insufficient documentation

## 2021-10-21 DIAGNOSIS — O368130 Decreased fetal movements, third trimester, not applicable or unspecified: Secondary | ICD-10-CM | POA: Insufficient documentation

## 2021-10-21 DIAGNOSIS — Z3689 Encounter for other specified antenatal screening: Secondary | ICD-10-CM

## 2021-10-21 NOTE — MAU Note (Addendum)
Presents with c/o decreased FM since Friday.  Denies LOF or VB. No  pain.

## 2021-10-22 DIAGNOSIS — O368130 Decreased fetal movements, third trimester, not applicable or unspecified: Secondary | ICD-10-CM | POA: Diagnosis present

## 2021-10-22 DIAGNOSIS — Z3A34 34 weeks gestation of pregnancy: Secondary | ICD-10-CM | POA: Diagnosis not present

## 2021-10-22 NOTE — MAU Provider Note (Signed)
History     CSN: [id number]  Arrival date and time: 10/21/21 2119   Event Date/Time   First Provider Initiated Contact with Patient 10/22/21 0002      Chief Complaint  Patient presents with   Decreased Fetal Movement   HPI Ms. Domino Holten is a 20 y.o. year old G65P0 female at 49w0dweeks gestation who presents to MAU reporting DFM since Friday 10/19/2021. She reports she tried eating ice cream and drinking cold drinks, but still could not feel any movement. She endorses "lots " of movement since arriving to MAU. She denies VB, LOF or pain. Her mother is present and contributing to the history taking.    OB History     Gravida  1   Para      Term      Preterm      AB      Living         SAB      IAB      Ectopic      Multiple      Live Births              Past Medical History:  Diagnosis Date   ADHD (attention deficit hyperactivity disorder)    Anxiety    Depression     Past Surgical History:  Procedure Laterality Date   FRACTURE SURGERY     WISDOM TOOTH EXTRACTION      Family History  Problem Relation Age of Onset   Drug abuse Father    Alcoholism Father    Hypertension Maternal Grandmother     Social History   Tobacco Use   Smoking status: Never   Smokeless tobacco: Never  Vaping Use   Vaping Use: Never used  Substance Use Topics   Alcohol use: No   Drug use: No    Allergies: No Known Allergies  Medications Prior to Admission  Medication Sig Dispense Refill Last Dose   aspirin 81 MG chewable tablet Chew 1 tablet (81 mg total) by mouth daily. 30 tablet 5 10/19/2021   Blood Pressure Monitoring (BLOOD PRESSURE KIT) DEVI 1 kit by Does not apply route once a week. 1 each 0    Iron, Ferrous Sulfate, 325 (65 Fe) MG TABS Take 1 tablet by mouth every other day. 30 tablet 5 10/19/2021   Prenat-Fe Poly-Methfol-FA-DHA (VITAFOL ULTRA) 29-0.6-0.4-200 MG CAPS Take 1 capsule by mouth daily before breakfast. 90 capsule 4 10/19/2021    Review of  Systems  Constitutional: Negative.   HENT: Negative.    Eyes: Negative.   Respiratory: Negative.    Cardiovascular: Negative.   Gastrointestinal: Negative.   Endocrine: Negative.   Genitourinary:        DFM all day  Musculoskeletal: Negative.   Skin: Negative.   Allergic/Immunologic: Negative.   Neurological: Negative.   Hematological: Negative.   Psychiatric/Behavioral: Negative.    Physical Exam   Blood pressure 116/69, pulse (!) 107, temperature 98.2 F (36.8 C), temperature source Oral, resp. rate 18, height 5' 6"  (1.676 m), weight 94 kg, last menstrual period 02/26/2021, SpO2 97 %.  Physical Exam Vitals and nursing note reviewed. Exam conducted with a chaperone present.  Constitutional:      Appearance: Normal appearance. She is normal weight.  Cardiovascular:     Rate and Rhythm: Tachycardia present.  Pulmonary:     Effort: Pulmonary effort is normal.  Genitourinary:    Comments: deferred Skin:    General: Skin is warm and dry.  Neurological:     Mental Status: She is alert.  Psychiatric:        Mood and Affect: Mood normal.        Behavior: Behavior normal.        Thought Content: Thought content normal.        Judgment: Judgment normal.   REACTIVE NST - FHR: 140 bpm / moderate variability / accels present / decels absent / TOCO: occ. UC's with UI noted MAU Course  Procedures  MDM CEFM   Assessment and Plan  NST (non-stress test) reactive  - Reassurance given that baby's well-being appears to be normal and healthy on the monitor  [redacted] weeks gestation of pregnancy   - Discharge patient - Keep scheduled NST/BPP appts with [facility] on 12/14 and 12/21  Keep scheduled appt with [facility] on 12/21 - Patient verbalized an understanding of the plan of care and agrees.     Laury Deep, CNM 10/22/2021, 12:02 AM

## 2021-10-23 NOTE — Addendum Note (Signed)
Addended by: Novella Olive on: 10/23/2021 10:12 AM   Modules accepted: Level of Service

## 2021-10-24 ENCOUNTER — Ambulatory Visit: Payer: Medicaid Other | Admitting: *Deleted

## 2021-10-24 ENCOUNTER — Other Ambulatory Visit: Payer: Self-pay

## 2021-10-24 ENCOUNTER — Ambulatory Visit (INDEPENDENT_AMBULATORY_CARE_PROVIDER_SITE_OTHER): Payer: Medicaid Other

## 2021-10-24 VITALS — BP 118/71 | HR 103

## 2021-10-24 DIAGNOSIS — O133 Gestational [pregnancy-induced] hypertension without significant proteinuria, third trimester: Secondary | ICD-10-CM | POA: Diagnosis not present

## 2021-10-24 NOTE — Progress Notes (Signed)

## 2021-10-31 ENCOUNTER — Ambulatory Visit (INDEPENDENT_AMBULATORY_CARE_PROVIDER_SITE_OTHER): Payer: Medicaid Other | Admitting: General Practice

## 2021-10-31 ENCOUNTER — Other Ambulatory Visit: Payer: Self-pay

## 2021-10-31 ENCOUNTER — Ambulatory Visit (INDEPENDENT_AMBULATORY_CARE_PROVIDER_SITE_OTHER): Payer: Medicaid Other | Admitting: Obstetrics and Gynecology

## 2021-10-31 ENCOUNTER — Ambulatory Visit (INDEPENDENT_AMBULATORY_CARE_PROVIDER_SITE_OTHER): Payer: Medicaid Other

## 2021-10-31 ENCOUNTER — Other Ambulatory Visit (HOSPITAL_COMMUNITY)
Admission: RE | Admit: 2021-10-31 | Discharge: 2021-10-31 | Disposition: A | Discharge status: Home / Self Care | Payer: Medicaid Other | Source: Ambulatory Visit | Attending: Obstetrics and Gynecology | Admitting: Obstetrics and Gynecology

## 2021-10-31 ENCOUNTER — Encounter: Payer: Self-pay | Admitting: Obstetrics and Gynecology

## 2021-10-31 VITALS — BP 131/77 | HR 112 | Wt 206.1 lb

## 2021-10-31 VITALS — BP 117/73 | HR 124

## 2021-10-31 DIAGNOSIS — O133 Gestational [pregnancy-induced] hypertension without significant proteinuria, third trimester: Secondary | ICD-10-CM | POA: Diagnosis not present

## 2021-10-31 DIAGNOSIS — Z3403 Encounter for supervision of normal first pregnancy, third trimester: Secondary | ICD-10-CM | POA: Diagnosis not present

## 2021-10-31 LAB — OB RESULTS CONSOLE GC/CHLAMYDIA: Gonorrhea: NEGATIVE

## 2021-10-31 NOTE — Progress Notes (Signed)
Pt informed that the ultrasound is considered a limited OB ultrasound and is not intended to be a complete ultrasound exam.  Patient also informed that the ultrasound is not being completed with the intent of assessing for fetal or placental anomalies or any pelvic abnormalities.  Explained that the purpose of today’s ultrasound is to assess for  BPP, presentation, and AFI.  Patient acknowledges the purpose of the exam and the limitations of the study.    ° °Jerzy Roepke H RN BSN °10/31/21 ° °

## 2021-10-31 NOTE — Progress Notes (Signed)
Subjective:  Yesenia Clarke is a 20 y.o. G1P0 at [redacted]w[redacted]d being seen today for ongoing prenatal care.  She is currently monitored for the following issues for this high-risk pregnancy and has ADHD (attention deficit hyperactivity disorder), combined type; MDD (major depressive disorder), recurrent episode, severe (HCC); Encounter for supervision of normal first pregnancy; Gestational hypertension, third trimester; and Obesity in pregnancy, antepartum on their problem list.  Patient reports general discomofrts of pregnancy. Denies HA or visual changes.  Contractions: Irregular. Vag. Bleeding: None.  Movement: Present. Denies leaking of fluid.   The following portions of the patient's history were reviewed and updated as appropriate: allergies, current medications, past family history, past medical history, past social history, past surgical history and problem list. Problem list updated.  Objective:   Vitals:   10/31/21 1609  BP: 131/77  Pulse: (!) 112  Weight: 206 lb 1.6 oz (93.5 kg)    Fetal Status: Fetal Heart Rate (bpm): 140   Movement: Present     General:  Alert, oriented and cooperative. Patient is in no acute distress.  Skin: Skin is warm and dry. No rash noted.   Cardiovascular: Normal heart rate noted  Respiratory: Normal respiratory effort, no problems with respiration noted  Abdomen: Soft, gravid, appropriate for gestational age. Pain/Pressure: Present     Pelvic:  Cervical exam performed        Extremities: Normal range of motion.  Edema: None  Mental Status: Normal mood and affect. Normal behavior. Normal judgment and thought content.   Urinalysis:      Assessment and Plan:  Pregnancy: G1P0 at [redacted]w[redacted]d  1. Encounter for supervision of normal first pregnancy in third trimester Labor precautions - Strep Gp B NAA - Cervicovaginal ancillary only( )  2. Gestational hypertension, third trimester BP stable, no S/Sx of PEC BPP today IOL at 37 weeks scheduled  today  Preterm labor symptoms and general obstetric precautions including but not limited to vaginal bleeding, contractions, leaking of fluid and fetal movement were reviewed in detail with the patient. Please refer to After Visit Summary for other counseling recommendations.  Return in about 1 week (around 11/07/2021) for OB visit, face to face, MD only.   Hermina Staggers, MD

## 2021-10-31 NOTE — Progress Notes (Signed)
Patient presents for ROB and GBS. Patient has no concerns today. 

## 2021-10-31 NOTE — Patient Instructions (Signed)
Vaginal Delivery ?Vaginal delivery means that you give birth by pushing your baby out of your birth canal (vagina). Your health care team will help you before, during, and after vaginal delivery. ?Birth experiences are unique for every woman and every pregnancy, and birth experiences vary depending on where you choose to give birth. ?What are the risks and benefits? ?Generally, this is safe. However, problems may occur, including: ?Bleeding. ?Infection. ?Damage to other structures such as vaginal tearing. ?Allergic reactions to medicines. ?Despite the risks, benefits of vaginal delivery include less risk of bleeding and infection and a shorter recovery time compared to a Cesarean delivery. Cesarean delivery, or C-section, is the surgical delivery of a baby. ?What happens when I arrive at the birth center or hospital? ?Once you are in labor and have been admitted into the hospital or birth center, your health care team may: ?Review your pregnancy history and any concerns that you have. ?Talk with you about your birth plan and discuss pain control options. ?Check your blood pressure, breathing, and heartbeat. ?Assess your baby's heartbeat. ?Monitor your uterus for contractions. ?Check whether your bag of water (amniotic sac) has broken (ruptured). ?Insert an IV into one of your veins. This may be used to give you fluids and medicines. ?Monitoring ?Your health care team may assess your contractions (uterine monitoring) and your baby's heart rate (fetal monitoring). You may need to be monitored: ?Often, but not continuously (intermittently). ?All the time or for long periods at a time (continuously). Continuous monitoring may be needed if: ?You are taking certain medicines, such as medicine to relieve pain or make your contractions stronger. ?You have pregnancy or labor complications. ?Monitoring may be done by: ?Placing a special stethoscope or a handheld monitoring device on your abdomen to check your baby's heartbeat  and to check for contractions. ?Placing monitors on your abdomen (external monitors) to record your baby's heartbeat and the frequency and length of contractions. ?Placing monitors inside your uterus through your vagina (internal monitors) to record your baby's heartbeat and the frequency, length, and strength of your contractions. Depending on the type of monitor, it may remain in your uterus or on your baby's head until birth. ?Telemetry. This is a type of continuous monitoring that can be done with external or internal monitors. Instead of having to stay in bed, you are able to move around. ?Physical exam ?Your health care team may perform frequent physical exams. This may include: ?Checking how and where your baby is positioned in your uterus. ?Checking your cervix to determine: ?Whether it is thinning out (effacing). ?Whether it is opening up (dilating). ?What happens during labor and delivery? ?Normal labor and delivery is divided into the following three stages: ?Stage 1 ?This is the longest stage of labor. ?Throughout this stage, you will feel contractions. Contractions generally feel mild, infrequent, and irregular at first. They get stronger, more frequent, and more regular as you move through this stage. You may have contractions about every 2-3 minutes. ?This stage ends when your cervix is completely dilated to 4 inches (10 cm) and completely effaced. ?Stage 2 ?This stage starts once your cervix is completely effaced and dilated and lasts until the delivery of your baby. ?This is the stage where you will feel an urge to push your baby out of your vagina. ?You may feel stretching and burning pain, especially when the widest part of your baby's head passes through the vaginal opening (crowning). ?Once your baby is delivered, the umbilical cord will be clamped and   cut. Timing of cutting the cord will depend on your wishes, your baby's health, and your health care provider's practices. ?Your baby will be  placed on your bare chest (skin-to-skin contact) in an upright position and covered with a warm blanket. If you are choosing to breastfeed, watch your baby for feeding cues, like rooting or sucking, and help the baby to your breast for his or her first feeding. ?Stage 3 ?This stage starts immediately after the birth of your baby and ends after you deliver the placenta. ?This stage may take anywhere from 5 to 30 minutes. ?After your baby has been delivered, you will feel contractions as your body expels the placenta. These contractions also help your uterus get smaller and reduce bleeding. ?What can I expect after labor and delivery? ?After labor is over, you and your baby will be assessed closely until you are ready to go home. Your health care team will teach you how to care for yourself and your baby. ?You and your baby may be encouraged to stay in the same room (rooming in) during your hospital stay. This will help promote early bonding and successful breastfeeding. ?Your uterus will be checked and massaged regularly (fundal massage). ?You may continue to receive fluids and medicines through an IV. ?You will have some soreness and pain in your abdomen, vagina, and the area of skin between your vaginal opening and your anus (perineum). ?If an incision was made near your vagina (episiotomy) or if you had some vaginal tearing during delivery, cold compresses may be placed on your episiotomy or your tear. This helps to reduce pain and swelling. ?It is normal to have vaginal bleeding after delivery. Wear a sanitary pad for vaginal bleeding and discharge. ?Summary ?Vaginal delivery means that you will give birth by pushing your baby out of your birth canal (vagina). ?Your health care team will monitor you and your baby throughout the stages of labor. ?After you deliver your baby, your health care team will continue to assess you and your baby to ensure you are both recovering as expected after delivery. ?This  information is not intended to replace advice given to you by your health care provider. Make sure you discuss any questions you have with your health care provider. ?Document Revised: 09/25/2020 Document Reviewed: 09/25/2020 ?Elsevier Patient Education ? 2022 Elsevier Inc. ? ?

## 2021-11-01 LAB — CERVICOVAGINAL ANCILLARY ONLY
Chlamydia: NEGATIVE
Comment: NEGATIVE
Comment: NEGATIVE
Comment: NORMAL
Neisseria Gonorrhea: NEGATIVE
Trichomonas: NEGATIVE

## 2021-11-02 ENCOUNTER — Encounter: Payer: Self-pay | Admitting: Obstetrics and Gynecology

## 2021-11-02 DIAGNOSIS — O99820 Streptococcus B carrier state complicating pregnancy: Secondary | ICD-10-CM | POA: Insufficient documentation

## 2021-11-02 LAB — STREP GP B NAA: Strep Gp B NAA: POSITIVE — AB

## 2021-11-05 ENCOUNTER — Telehealth (HOSPITAL_COMMUNITY): Payer: Self-pay | Admitting: *Deleted

## 2021-11-05 ENCOUNTER — Encounter (HOSPITAL_COMMUNITY): Payer: Self-pay | Admitting: *Deleted

## 2021-11-05 NOTE — Telephone Encounter (Signed)
Preadmission screen  

## 2021-11-07 ENCOUNTER — Ambulatory Visit: Payer: Medicaid Other | Attending: Obstetrics

## 2021-11-07 ENCOUNTER — Other Ambulatory Visit: Payer: Self-pay

## 2021-11-07 ENCOUNTER — Ambulatory Visit: Payer: Medicaid Other | Admitting: *Deleted

## 2021-11-07 VITALS — BP 119/71 | HR 103

## 2021-11-07 DIAGNOSIS — O133 Gestational [pregnancy-induced] hypertension without significant proteinuria, third trimester: Secondary | ICD-10-CM | POA: Insufficient documentation

## 2021-11-07 DIAGNOSIS — Z3A36 36 weeks gestation of pregnancy: Secondary | ICD-10-CM

## 2021-11-08 ENCOUNTER — Ambulatory Visit (INDEPENDENT_AMBULATORY_CARE_PROVIDER_SITE_OTHER): Payer: Medicaid Other | Admitting: Obstetrics and Gynecology

## 2021-11-08 VITALS — BP 116/76 | HR 108 | Wt 206.0 lb

## 2021-11-08 DIAGNOSIS — O133 Gestational [pregnancy-induced] hypertension without significant proteinuria, third trimester: Secondary | ICD-10-CM

## 2021-11-08 DIAGNOSIS — Z3403 Encounter for supervision of normal first pregnancy, third trimester: Secondary | ICD-10-CM

## 2021-11-08 DIAGNOSIS — O99820 Streptococcus B carrier state complicating pregnancy: Secondary | ICD-10-CM

## 2021-11-08 DIAGNOSIS — Z3A36 36 weeks gestation of pregnancy: Secondary | ICD-10-CM | POA: Insufficient documentation

## 2021-11-08 NOTE — Progress Notes (Signed)
° °  PRENATAL VISIT NOTE  Subjective:  Yesenia Clarke is a 20 y.o. G1P0 at [redacted]w[redacted]d being seen today for ongoing prenatal care.  She is currently monitored for the following issues for this high-risk pregnancy and has ADHD (attention deficit hyperactivity disorder), combined type; MDD (major depressive disorder), recurrent episode, severe (HCC); Encounter for supervision of normal first pregnancy; Gestational hypertension, third trimester; Obesity in pregnancy, antepartum; GBS (group B Streptococcus carrier), +RV culture, currently pregnant; and [redacted] weeks gestation of pregnancy on their problem list.  Patient doing well with no acute concerns today. She reports no complaints.  Contractions: Irritability. Vag. Bleeding: None.  Movement: Present. Denies leaking of fluid.   Discussed IOL due to gestational hypertension.  All questions answered.  The following portions of the patient's history were reviewed and updated as appropriate: allergies, current medications, past family history, past medical history, past social history, past surgical history and problem list. Problem list updated.  Objective:   Vitals:   11/08/21 1433  BP: 116/76  Pulse: (!) 108  Weight: 206 lb (93.4 kg)    Fetal Status: Fetal Heart Rate (bpm): 145 Fundal Height: 37 cm Movement: Present     General:  Alert, oriented and cooperative. Patient is in no acute distress.  Skin: Skin is warm and dry. No rash noted.   Cardiovascular: Normal heart rate noted  Respiratory: Normal respiratory effort, no problems with respiration noted  Abdomen: Soft, gravid, appropriate for gestational age.  Pain/Pressure: Absent     Pelvic: Cervical exam deferred        Extremities: Normal range of motion.  Edema: None  Mental Status:  Normal mood and affect. Normal behavior. Normal judgment and thought content.   Assessment and Plan:  Pregnancy: G1P0 at [redacted]w[redacted]d  1. Encounter for supervision of normal first pregnancy in third trimester IOL  scheduled at 37 weeks  2. [redacted] weeks gestation of pregnancy   3. Gestational hypertension, third trimester Normal BP today, no end organ symptoms  4. GBS (group B Streptococcus carrier), +RV culture, currently pregnant Treat in labor  Term labor symptoms and general obstetric precautions including but not limited to vaginal bleeding, contractions, leaking of fluid and fetal movement were reviewed in detail with the patient.  Please refer to After Visit Summary for other counseling recommendations.   No follow-ups on file.   Mariel Aloe, MD Faculty Attending Center for [facility]

## 2021-11-08 NOTE — Progress Notes (Signed)
ROB, has Induction scheduled for 11/12/2021

## 2021-11-11 NOTE — L&D Delivery Note (Addendum)
Delivery Note Yesenia Clarke is a 21 y.o. G1P0 at [redacted]w[redacted]d admitted for IOL due to gHTN.   GBS Status:  Positive/-- (12/21 1647) Maximum Maternal Temperature: 102.3  Labor course: Initial SVE: 1.5. Augmentation with: AROM, Pitocin, and Cytotec. She then progressed to complete.  ROM: 11/13/2021 at 1026 with clear/pink-tinged fluid  Birth: At 1323 a viable female was delivered via spontaneous vaginal delivery (Presentation: ROA). Nuchal cord present: No.  Shoulder dystocia was identified and managed appropriately with McRoberts maneuver and suprapubic pressure was applied until the anterior arm delivered and the body was then delivered. Infant cord was immediately clamped and cut and the infant was taken directly to the warmer for further evaluation by NICU staff.  Cord blood was collected.  The placenta separated spontaneously and delivered via gentle cord traction.  Pitocin infused rapidly IV per protocol.  Fundus firm with massage. Patient was noted to have a hemostatic 1st degree perineal laceration. She then developed increased vaginal bleeding with source difficult to evaluate due to bleeding. The vagina was briefly packed to help with hemostasis and OBGYN called to bedside for further evaluation. Upon evaluation, patient was noted to have only minimal bleeding and this was felt to be uterine bleeding thus no intervention was completed.  Placenta inspected and appears to be intact with a 3 VC.  Placenta/Cord with the following complications: None .  Cord pH: 7.269 Sponge and instrument count were correct x2.  Intrapartum complications:  Chorio, PIH, and Shoulder Dystocia Anesthesia:  epidural Episiotomy: none Lacerations:  1st degree, hemostatic Suture Repair:  None EBL (mL): 300   Infant: APGAR (1 MIN):  1 APGAR (5 MINS):  5 APGAR (10 MINS):   9 Infant weight: 3250 g  Mom to postpartum.  Baby to Couplet care / Skin to Skin. Placenta to L&D   Plans to Breastfeed Contraception: oral  progesterone-only contraceptive Circumcision: N/A  Note sent to [facility]: [facility] for pp visit.  Raylene Everts CNM, [facility] 11/14/2021 2:30 PM  Midwife attestation: I was gloved and present for delivery in its entirety and I agree with the above resident's note.  Sharen Counter, CNM 5:58 PM

## 2021-11-12 ENCOUNTER — Inpatient Hospital Stay (HOSPITAL_COMMUNITY)
Admission: AD | Admit: 2021-11-12 | Discharge: 2021-11-16 | DRG: 768 | Disposition: A | Discharge status: Home / Self Care | Payer: Medicaid Other | Attending: Obstetrics and Gynecology | Admitting: Obstetrics and Gynecology

## 2021-11-12 ENCOUNTER — Inpatient Hospital Stay (HOSPITAL_COMMUNITY): Payer: Medicaid Other | Admitting: Anesthesiology

## 2021-11-12 ENCOUNTER — Other Ambulatory Visit: Payer: Self-pay

## 2021-11-12 ENCOUNTER — Inpatient Hospital Stay (HOSPITAL_COMMUNITY): Payer: Medicaid Other

## 2021-11-12 ENCOUNTER — Encounter (HOSPITAL_COMMUNITY): Payer: Self-pay | Admitting: Obstetrics and Gynecology

## 2021-11-12 DIAGNOSIS — Z7982 Long term (current) use of aspirin: Secondary | ICD-10-CM | POA: Diagnosis not present

## 2021-11-12 DIAGNOSIS — O134 Gestational [pregnancy-induced] hypertension without significant proteinuria, complicating childbirth: Principal | ICD-10-CM | POA: Diagnosis present

## 2021-11-12 DIAGNOSIS — Z3A37 37 weeks gestation of pregnancy: Secondary | ICD-10-CM

## 2021-11-12 DIAGNOSIS — O139 Gestational [pregnancy-induced] hypertension without significant proteinuria, unspecified trimester: Secondary | ICD-10-CM | POA: Diagnosis present

## 2021-11-12 DIAGNOSIS — Z20822 Contact with and (suspected) exposure to covid-19: Secondary | ICD-10-CM | POA: Diagnosis present

## 2021-11-12 DIAGNOSIS — Z23 Encounter for immunization: Secondary | ICD-10-CM

## 2021-11-12 DIAGNOSIS — O99214 Obesity complicating childbirth: Secondary | ICD-10-CM | POA: Diagnosis present

## 2021-11-12 DIAGNOSIS — O99824 Streptococcus B carrier state complicating childbirth: Secondary | ICD-10-CM | POA: Diagnosis present

## 2021-11-12 DIAGNOSIS — O411230 Chorioamnionitis, third trimester, not applicable or unspecified: Secondary | ICD-10-CM | POA: Diagnosis present

## 2021-11-12 DIAGNOSIS — O99820 Streptococcus B carrier state complicating pregnancy: Secondary | ICD-10-CM | POA: Diagnosis not present

## 2021-11-12 LAB — COMPREHENSIVE METABOLIC PANEL
ALT: 19 U/L (ref 0–44)
AST: 20 U/L (ref 15–41)
Albumin: 2.9 g/dL — ABNORMAL LOW (ref 3.5–5.0)
Alkaline Phosphatase: 154 U/L — ABNORMAL HIGH (ref 38–126)
Anion gap: 12 (ref 5–15)
BUN: <5 mg/dL — ABNORMAL LOW (ref 6–20)
CO2: 20 mmol/L — ABNORMAL LOW (ref 22–32)
Calcium: 9.5 mg/dL (ref 8.9–10.3)
Chloride: 105 mmol/L (ref 98–111)
Creatinine, Ser: 0.71 mg/dL (ref 0.44–1.00)
GFR, Estimated: >60 mL/min (ref >60)
Glucose, Bld: 103 mg/dL — ABNORMAL HIGH (ref 70–99)
Potassium: 3.7 mmol/L (ref 3.5–5.1)
Sodium: 137 mmol/L (ref 135–145)
Total Bilirubin: 1.1 mg/dL (ref 0.3–1.2)
Total Protein: 7.1 g/dL (ref 6.5–8.1)

## 2021-11-12 LAB — PROTEIN / CREATININE RATIO, URINE
Creatinine, Urine: 127.53 mg/dL
Protein Creatinine Ratio: 0.09 mg/mg{Cre} (ref 0.00–0.15)
Total Protein, Urine: 12 mg/dL

## 2021-11-12 LAB — CBC
HCT: 36.1 % (ref 36.0–46.0)
Hemoglobin: 11.9 g/dL — ABNORMAL LOW (ref 12.0–15.0)
MCH: 28.9 pg (ref 26.0–34.0)
MCHC: 33 g/dL (ref 30.0–36.0)
MCV: 87.6 fL (ref 80.0–100.0)
Platelets: 186 10*3/uL (ref 150–400)
RBC: 4.12 MIL/uL (ref 3.87–5.11)
RDW: 14.7 % (ref 11.5–15.5)
WBC: 6.7 10*3/uL (ref 4.0–10.5)
nRBC: 0 % (ref 0.0–0.2)

## 2021-11-12 LAB — URINALYSIS, ROUTINE W REFLEX MICROSCOPIC
Bilirubin Urine: NEGATIVE
Glucose, UA: NEGATIVE mg/dL
Hgb urine dipstick: NEGATIVE
Ketones, ur: NEGATIVE mg/dL
Leukocytes,Ua: NEGATIVE
Nitrite: NEGATIVE
Protein, ur: NEGATIVE mg/dL
Specific Gravity, Urine: 1.004 — ABNORMAL LOW (ref 1.005–1.030)
pH: 7 (ref 5.0–8.0)

## 2021-11-12 LAB — RESP PANEL BY RT-PCR (FLU A&B, COVID) ARPGX2
Influenza A by PCR: NEGATIVE
Influenza B by PCR: NEGATIVE
SARS Coronavirus 2 by RT PCR: NEGATIVE

## 2021-11-12 LAB — TYPE AND SCREEN
ABO/RH(D): B POS
Antibody Screen: NEGATIVE

## 2021-11-12 LAB — RPR: RPR Ser Ql: NONREACTIVE

## 2021-11-12 MED ORDER — OXYTOCIN-SODIUM CHLORIDE 30-0.9 UT/500ML-% IV SOLN
1.0000 m[IU]/min | INTRAVENOUS | Status: DC
  Administered 2021-11-12: 2 m[IU]/min via INTRAVENOUS

## 2021-11-12 MED ORDER — OXYCODONE-ACETAMINOPHEN 5-325 MG PO TABS: 1.0000 | ORAL_TABLET | ORAL | Status: DC | PRN

## 2021-11-12 MED ORDER — OXYTOCIN-SODIUM CHLORIDE 30-0.9 UT/500ML-% IV SOLN
2.5000 [IU]/h | INTRAVENOUS | Status: DC
  Administered 2021-11-14: 2.5 [IU]/h via INTRAVENOUS
  Filled 2021-11-12 (×2): qty 500

## 2021-11-12 MED ORDER — ONDANSETRON HCL 4 MG/2ML IJ SOLN
4.0000 mg | Freq: Four times a day (QID) | INTRAMUSCULAR | Status: DC | PRN
  Administered 2021-11-12 – 2021-11-14 (×2): 4 mg via INTRAVENOUS
  Filled 2021-11-12 (×2): qty 2

## 2021-11-12 MED ORDER — EPHEDRINE 5 MG/ML INJ: 10.0000 mg | INTRAVENOUS | Status: DC | PRN

## 2021-11-12 MED ORDER — DIPHENHYDRAMINE HCL 50 MG/ML IJ SOLN: 12.5000 mg | INTRAMUSCULAR | Status: DC | PRN

## 2021-11-12 MED ORDER — FENTANYL-BUPIVACAINE-NACL 0.5-0.125-0.9 MG/250ML-% EP SOLN
EPIDURAL | Status: DC | PRN
  Administered 2021-11-12: 12 mL/h via EPIDURAL

## 2021-11-12 MED ORDER — OXYTOCIN-SODIUM CHLORIDE 30-0.9 UT/500ML-% IV SOLN
1.0000 m[IU]/min | INTRAVENOUS | Status: DC
  Administered 2021-11-13: 2 m[IU]/min via INTRAVENOUS
  Administered 2021-11-14: 22 m[IU]/min via INTRAVENOUS
  Filled 2021-11-12: qty 500

## 2021-11-12 MED ORDER — SOD CITRATE-CITRIC ACID 500-334 MG/5ML PO SOLN: 30.0000 mL | ORAL | Status: DC | PRN

## 2021-11-12 MED ORDER — PENICILLIN G POT IN DEXTROSE 60000 UNIT/ML IV SOLN
3.0000 10*6.[IU] | INTRAVENOUS | Status: DC
  Administered 2021-11-12 – 2021-11-13 (×7): 3 10*6.[IU] via INTRAVENOUS
  Filled 2021-11-12 (×7): qty 50

## 2021-11-12 MED ORDER — MISOPROSTOL 50MCG HALF TABLET
50.0000 ug | ORAL_TABLET | ORAL | Status: DC
  Administered 2021-11-12: 50 ug via BUCCAL

## 2021-11-12 MED ORDER — OXYCODONE-ACETAMINOPHEN 5-325 MG PO TABS: 2.0000 | ORAL_TABLET | ORAL | Status: DC | PRN

## 2021-11-12 MED ORDER — MISOPROSTOL 50MCG HALF TABLET
ORAL_TABLET | ORAL | Status: AC
  Filled 2021-11-12: qty 1

## 2021-11-12 MED ORDER — LACTATED RINGERS IV SOLN: INTRAVENOUS | Status: DC

## 2021-11-12 MED ORDER — LACTATED RINGERS IV SOLN: 500.0000 mL | Freq: Once | INTRAVENOUS | Status: DC

## 2021-11-12 MED ORDER — TERBUTALINE SULFATE 1 MG/ML IJ SOLN: 0.2500 mg | Freq: Once | INTRAMUSCULAR | Status: DC | PRN

## 2021-11-12 MED ORDER — LACTATED RINGERS IV SOLN
500.0000 mL | INTRAVENOUS | Status: DC | PRN
  Administered 2021-11-13: 500 mL via INTRAVENOUS

## 2021-11-12 MED ORDER — FENTANYL CITRATE (PF) 100 MCG/2ML IJ SOLN
50.0000 ug | INTRAMUSCULAR | Status: DC | PRN
  Administered 2021-11-12 – 2021-11-14 (×5): 100 ug via INTRAVENOUS
  Filled 2021-11-12 (×5): qty 2

## 2021-11-12 MED ORDER — LIDOCAINE HCL (PF) 1 % IJ SOLN: 30.0000 mL | INTRAMUSCULAR | Status: DC | PRN

## 2021-11-12 MED ORDER — ACETAMINOPHEN 325 MG PO TABS
650.0000 mg | ORAL_TABLET | ORAL | Status: DC | PRN
  Administered 2021-11-12 – 2021-11-14 (×5): 650 mg via ORAL
  Filled 2021-11-12 (×5): qty 2

## 2021-11-12 MED ORDER — PHENYLEPHRINE 40 MCG/ML (10ML) SYRINGE FOR IV PUSH (FOR BLOOD PRESSURE SUPPORT)
80.0000 ug | PREFILLED_SYRINGE | INTRAVENOUS | Status: DC | PRN
  Filled 2021-11-12: qty 10

## 2021-11-12 MED ORDER — PHENYLEPHRINE 40 MCG/ML (10ML) SYRINGE FOR IV PUSH (FOR BLOOD PRESSURE SUPPORT)
80.0000 ug | PREFILLED_SYRINGE | INTRAVENOUS | Status: DC | PRN
  Administered 2021-11-12: 80 ug via INTRAVENOUS

## 2021-11-12 MED ORDER — SODIUM CHLORIDE 0.9 % IV SOLN
5.0000 10*6.[IU] | Freq: Once | INTRAVENOUS | Status: AC
  Administered 2021-11-12: 5 10*6.[IU] via INTRAVENOUS
  Filled 2021-11-12: qty 5

## 2021-11-12 MED ORDER — OXYTOCIN BOLUS FROM INFUSION
333.0000 mL | Freq: Once | INTRAVENOUS | Status: AC
  Administered 2021-11-14: 333 mL via INTRAVENOUS

## 2021-11-12 MED ORDER — LIDOCAINE HCL (PF) 1 % IJ SOLN
INTRAMUSCULAR | Status: DC | PRN
  Administered 2021-11-12: 10 mL via EPIDURAL
  Administered 2021-11-12: 2 mL via EPIDURAL

## 2021-11-12 MED ORDER — FENTANYL-BUPIVACAINE-NACL 0.5-0.125-0.9 MG/250ML-% EP SOLN
12.0000 mL/h | EPIDURAL | Status: DC | PRN
  Administered 2021-11-14: 12 mL/h via EPIDURAL
  Filled 2021-11-12 (×4): qty 250

## 2021-11-12 NOTE — Progress Notes (Signed)
Latreese Dotterer is a 21 y.o. G1P0 at [redacted]w[redacted]d by LMP admitted for induction of labor due to Gestational Hypertension.  Subjective: Patient seen at the bedside, now status post epidural and reports significant improvement in pain. Foley bulb remains in place at this time, nursing reports completing recent cervical exam. No acute concerns at this time.   Objective: BP (!) 106/51    Pulse 90    Temp 98.5 F (36.9 C) (Oral)    Resp 16    Ht 5\' 6"  (1.676 m)    Wt 94.8 kg    LMP 02/26/2021    BMI 33.73 kg/m  No intake/output data recorded. No intake/output data recorded.  FHT:  FHR: 140 bpm, variability: moderate,  accelerations:  Present,  decelerations:  Absent UC:   irregular, every 1-4 minutes SVE:   Dilation: 1.5 Exam by:: lee  Labs: Lab Results  Component Value Date   WBC 6.7 11/12/2021   HGB 11.9 (L) 11/12/2021   HCT 36.1 11/12/2021   MCV 87.6 11/12/2021   PLT 186 11/12/2021    Assessment / Plan: Induction of labor due to gestational hypertension,  progressing well with foley bulb with plan to start low-dose pitocin  Labor:  Progressing well with foley bulb  and now on low-dose pitocin Preeclampsia:  no signs or symptoms of toxicity and labs stable Fetal Wellbeing:  Category I Pain Control:  Epidural I/D:  n/a Anticipated MOD:  NSVD  Pricilla Loveless. Mikyle Sox 11/12/2021, 3:30 PM

## 2021-11-12 NOTE — Anesthesia Preprocedure Evaluation (Signed)
Anesthesia Evaluation  Patient identified by MRN, date of birth, ID band Patient awake    Reviewed: Allergy & Precautions, Patient's Chart, lab work & pertinent test results  Airway Mallampati: II  TM Distance: >3 FB Neck ROM: Full    Dental no notable dental hx.    Pulmonary neg pulmonary ROS,    Pulmonary exam normal breath sounds clear to auscultation       Cardiovascular hypertension (PIH no meds- baseline BPs in labor have been SBPs in the 120s), Normal cardiovascular exam Rhythm:Regular Rate:Normal     Neuro/Psych PSYCHIATRIC DISORDERS Anxiety Depression negative neurological ROS     GI/Hepatic negative GI ROS, Neg liver ROS,   Endo/Other  negative endocrine ROSBMI 34  Renal/GU negative Renal ROS  negative genitourinary   Musculoskeletal negative musculoskeletal ROS (+)   Abdominal   Peds negative pediatric ROS (+)  Hematology negative hematology ROS (+) hct 36.1, plt 186   Anesthesia Other Findings   Reproductive/Obstetrics (+) Pregnancy                             Anesthesia Physical Anesthesia Plan  ASA: 2  Anesthesia Plan: Epidural   Post-op Pain Management:    Induction:   PONV Risk Score and Plan: 2  Airway Management Planned: Natural Airway  Additional Equipment: None  Intra-op Plan:   Post-operative Plan:   Informed Consent: I have reviewed the patients History and Physical, chart, labs and discussed the procedure including the risks, benefits and alternatives for the proposed anesthesia with the patient or authorized representative who has indicated his/her understanding and acceptance.       Plan Discussed with:   Anesthesia Plan Comments:         Anesthesia Quick Evaluation

## 2021-11-12 NOTE — Progress Notes (Signed)
Yesenia Clarke is a 21 y.o. G1P0 at [redacted]w[redacted]d by LMP admitted for induction of labor due to [facility].  Subjective: Pt comfortable, mild irregular cramping. Family in room for support.  Objective: BP 116/64    Pulse (!) 106    Temp 99 F (37.2 C) (Oral)    Resp 16    Ht 5\' 6"  (1.676 m)    Wt 94.8 kg    LMP 02/26/2021    BMI 33.73 kg/m  No intake/output data recorded. No intake/output data recorded.  FHT:  FHR: 135 bpm, variability: moderate,  accelerations:  Present,  decelerations:  Absent UC:   rare SVE:   1/thick/ballottable  Foley balloon placed with some difficulty. Pt given Fentanyl 100 mcg IV and then tolerated completion of the procedure. Balloon filled to 60 cc by RN.  Labs: Lab Results  Component Value Date   WBC 6.7 11/12/2021   HGB 11.9 (L) 11/12/2021   HCT 36.1 11/12/2021   MCV 87.6 11/12/2021   PLT 186 11/12/2021    Assessment / Plan: Induction of labor due to GHTN FB placed GBS positive  Labor: Progressing normally Preeclampsia:  labs stable Fetal Wellbeing:  Category I Pain Control:  IV pain meds I/D:   GBS positive on PCN Anticipated MOD:  NSVD  01/10/2022 11/12/2021, 6:51 PM

## 2021-11-12 NOTE — Anesthesia Procedure Notes (Signed)
Epidural Patient location during procedure: OB Start time: 11/12/2021 2:32 PM End time: 11/12/2021 2:41 PM  Staffing Anesthesiologist: Lannie Fields, DO Performed: anesthesiologist   Preanesthetic Checklist Completed: patient identified, IV checked, risks and benefits discussed, monitors and equipment checked, pre-op evaluation and timeout performed  Epidural Patient position: sitting Prep: DuraPrep and site prepped and draped Patient monitoring: continuous pulse ox, blood pressure, heart rate and cardiac monitor Approach: midline Location: L3-L4 Injection technique: LOR air  Needle:  Needle type: Tuohy  Needle gauge: 17 G Needle length: 9 cm Needle insertion depth: 7 cm Catheter type: closed end flexible Catheter size: 19 Gauge Catheter at skin depth: 12 cm Test dose: negative  Assessment Sensory level: T8 Events: blood not aspirated, injection not painful, no injection resistance, no paresthesia and negative IV test  Additional Notes Patient identified. Risks/Benefits/Options discussed with patient including but not limited to bleeding, infection, nerve damage, paralysis, failed block, incomplete pain control, headache, blood pressure changes, nausea, vomiting, reactions to medication both or allergic, itching and postpartum back pain. Confirmed with bedside nurse the patient's most recent platelet count. Confirmed with patient that they are not currently taking any anticoagulation, have any bleeding history or any family history of bleeding disorders. Patient expressed understanding and wished to proceed. All questions were answered. Sterile technique was used throughout the entire procedure. Please see nursing notes for vital signs. Test dose was given through epidural catheter and negative prior to continuing to dose epidural or start infusion. Warning signs of high block given to the patient including shortness of breath, tingling/numbness in hands, complete motor block,  or any concerning symptoms with instructions to call for help. Patient was given instructions on fall risk and not to get out of bed. All questions and concerns addressed with instructions to call with any issues or inadequate analgesia.  Reason for block:procedure for pain

## 2021-11-12 NOTE — Progress Notes (Signed)
Labor Progress note  Called by RN for fetal HR baseline change from 160>180. Moderate variability still present. Patient last 4cm with intact membranes a few hours ago. Temp 100.90F around 2030 and given tylenol per RN, most recently WNL.    Pt reports she is feeling well-- no cough, SOB, congestion, dysuria, back pain etc. Foley catheter in place with epidural.   Recently received IV fluid bolus. Will continue to monitor FHT, overall reassuring. Collect U/A.   Allayne Stack, DO

## 2021-11-12 NOTE — H&P (Signed)
OBSTETRIC ADMISSION HISTORY AND PHYSICAL  Yesenia Clarke is a 21 y.o. female G1P0 with IUP at 64w0dby LMP presenting for IOL for GHTN. She reports +FMs, No LOF, no VB, no blurry vision, headaches or peripheral edema, and RUQ pain.  She plans on breast feeding. She request POPs for birth control. She received her prenatal care at  [facility] By LMP --->  Estimated Date of Delivery: 12/03/21  Sono:    @[redacted]w[redacted]d , CWD, normal anatomy, vertex presentation, 3198g, 81% EFW   Prenatal History/Complications: None  Past Medical History: Past Medical History:  Diagnosis Date   ADHD (attention deficit hyperactivity disorder)    Anxiety    Depression    Pregnancy induced hypertension     Past Surgical History: Past Surgical History:  Procedure Laterality Date   FRACTURE SURGERY     WISDOM TOOTH EXTRACTION      Obstetrical History: OB History     Gravida  1   Para      Term      Preterm      AB      Living         SAB      IAB      Ectopic      Multiple      Live Births              Social History Social History   Socioeconomic History   Marital status: Single    Spouse name: Not on file   Number of children: 0   Years of education: Not on file   Highest education level: Not on file  Occupational History   Occupation: full time  Tobacco Use   Smoking status: Never   Smokeless tobacco: Never  Vaping Use   Vaping Use: Never used  Substance and Sexual Activity   Alcohol use: No   Drug use: No   Sexual activity: Not Currently  Other Topics Concern   Not on file  Social History Narrative   Not on file   Social Determinants of Health   Financial Resource Strain: Not on file  Food Insecurity: Not on file  Transportation Needs: Not on file  Physical Activity: Not on file  Stress: Not on file  Social Connections: Not on file    Family History: Family History  Problem Relation Age of Onset   Drug abuse Father    Alcoholism Father     Hypertension Maternal Grandmother     Allergies: No Known Allergies  Medications Prior to Admission  Medication Sig Dispense Refill Last Dose   aspirin 81 MG chewable tablet Chew 1 tablet (81 mg total) by mouth daily. 30 tablet 5    Blood Pressure Monitoring (BLOOD PRESSURE KIT) DEVI 1 kit by Does not apply route once a week. 1 each 0    Iron, Ferrous Sulfate, 325 (65 Fe) MG TABS Take 1 tablet by mouth every other day. 30 tablet 5    Prenat-Fe Poly-Methfol-FA-DHA (VITAFOL ULTRA) 29-0.6-0.4-200 MG CAPS Take 1 capsule by mouth daily before breakfast. 90 capsule 4      Review of Systems   All systems reviewed and negative except as stated in HPI  Blood pressure 106/64, pulse 88, temperature 98.5 F (36.9 C), temperature source Oral, resp. rate 16, height 5' 6"  (1.676 m), weight 94.8 kg, last menstrual period 02/26/2021. General appearance: alert, cooperative, and no distress Lungs: clear to auscultation bilaterally Heart: regular rate and rhythm Abdomen: soft, non-tender;  bowel sounds normal Pelvic: n/a Extremities: Homans sign is negative, no sign of DVT DTR's wnl Presentation: cephalic Fetal monitoringBaseline: 145 bpm Uterine activity Frequency: Every 1-4 minutes Dilation: 1.5 Exam by:: lee   Prenatal labs: ABO, Rh: --/--/B POS (01/02 0840) Antibody: NEG (01/02 0840) Rubella: 6.82 (08/08 1110) RPR: NON REACTIVE (01/02 0840)  HBsAg: Negative (08/08 1110)  HIV: Non Reactive (11/01 0930)  GBS: Positive/-- (12/21 1647)  2 hr Glucola wnl Genetic screening  Horizon wnl, Panorama results not available to view Anatomy US wnl  Prenatal Transfer Tool  Maternal Diabetes: No Genetic Screening: Normal Maternal Ultrasounds/Referrals: Normal Fetal Ultrasounds or other Referrals:  None Maternal Substance Abuse:  No Significant Maternal Medications:  None Significant Maternal Lab Results: Group B Strep positive  Results for orders placed or performed during the hospital  encounter of 11/12/21 (from the past 24 hour(s))  Resp Panel by RT-PCR (Flu A&B, Covid) Nasopharyngeal Swab   Collection Time: 11/12/21  8:21 AM   Specimen: Nasopharyngeal Swab; Nasopharyngeal(NP) swabs in vial transport medium  Result Value Ref Range   SARS Coronavirus 2 by RT PCR NEGATIVE NEGATIVE   Influenza A by PCR NEGATIVE NEGATIVE   Influenza B by PCR NEGATIVE NEGATIVE  CBC   Collection Time: 11/12/21  8:40 AM  Result Value Ref Range   WBC 6.7 4.0 - 10.5 K/uL   RBC 4.12 3.87 - 5.11 MIL/uL   Hemoglobin 11.9 (L) 12.0 - 15.0 g/dL   HCT 36.1 36.0 - 46.0 %   MCV 87.6 80.0 - 100.0 fL   MCH 28.9 26.0 - 34.0 pg   MCHC 33.0 30.0 - 36.0 g/dL   RDW 14.7 11.5 - 15.5 %   Platelets 186 150 - 400 K/uL   nRBC 0.0 0.0 - 0.2 %  RPR   Collection Time: 11/12/21  8:40 AM  Result Value Ref Range   RPR Ser Ql NON REACTIVE NON REACTIVE  Comprehensive metabolic panel   Collection Time: 11/12/21  8:40 AM  Result Value Ref Range   Sodium 137 135 - 145 mmol/L   Potassium 3.7 3.5 - 5.1 mmol/L   Chloride 105 98 - 111 mmol/L   CO2 20 (L) 22 - 32 mmol/L   Glucose, Bld 103 (H) 70 - 99 mg/dL   BUN <5 (L) 6 - 20 mg/dL   Creatinine, Ser 0.71 0.44 - 1.00 mg/dL   Calcium 9.5 8.9 - 10.3 mg/dL   Total Protein 7.1 6.5 - 8.1 g/dL   Albumin 2.9 (L) 3.5 - 5.0 g/dL   AST 20 15 - 41 U/L   ALT 19 0 - 44 U/L   Alkaline Phosphatase 154 (H) 38 - 126 U/L   Total Bilirubin 1.1 0.3 - 1.2 mg/dL   GFR, Estimated >60 >60 mL/min   Anion gap 12 5 - 15  Type and screen   Collection Time: 11/12/21  8:40 AM  Result Value Ref Range   ABO/RH(D) B POS    Antibody Screen NEG    Sample Expiration      11/15/2021,2359 Performed at [facility], [address] [place], [place] [address]   Protein / creatinine ratio, urine   Collection Time: 11/12/21 10:00 AM  Result Value Ref Range   Creatinine, Urine 127.53 mg/dL   Total Protein, Urine 12 mg/dL   Protein Creatinine Ratio 0.09 0.00 - 0.15 mg/mg[Cre]     Patient Active Problem List   Diagnosis Date Noted   Gestational hypertension 11/12/2021   [redacted] weeks gestation  of pregnancy 11/08/2021   GBS (group B Streptococcus carrier), +RV culture, currently pregnant 11/02/2021   Gestational hypertension, third trimester 09/11/2021   Obesity in pregnancy, antepartum 09/11/2021   Encounter for supervision of normal first pregnancy 08/14/2021   ADHD (attention deficit hyperactivity disorder), combined type 04/14/2013   MDD (major depressive disorder), recurrent episode, severe ([place]) 04/14/2013    Assessment/Plan:  Casimira Sutphin is a 21 y.o. G1P0 at 36w0dhere for IOL for GHTN  #Labor: Cytotec, foley for cervical ripening #Pain: IV pain medication PRN #FWB: Category I #ID:  GBS positive, PCN ordered #MOF: breast #MOC:POPs #Circ:  N/a  LFatima Blank CNM  11/12/2021, 6:15 PM

## 2021-11-13 ENCOUNTER — Encounter (HOSPITAL_COMMUNITY): Payer: Self-pay | Admitting: Obstetrics and Gynecology

## 2021-11-13 MED ORDER — GENTAMICIN SULFATE 40 MG/ML IJ SOLN
5.0000 mg/kg | INTRAVENOUS | Status: DC
  Administered 2021-11-14: 370 mg via INTRAVENOUS
  Filled 2021-11-13: qty 9.25

## 2021-11-13 MED ORDER — SODIUM CHLORIDE 0.9 % IV SOLN
2.0000 g | Freq: Four times a day (QID) | INTRAVENOUS | Status: DC
  Administered 2021-11-13 – 2021-11-14 (×2): 2 g via INTRAVENOUS
  Filled 2021-11-13 (×2): qty 2000

## 2021-11-13 NOTE — Progress Notes (Addendum)
Labor Progress Note Yesenia Clarke is a 21 y.o. G1P0 at [redacted]w[redacted]d presented for IOL for gHTN.  S: Pt notes she is feeling well, no concerns. Endorses effective epidural.   O:  BP (!) 103/91    Pulse (!) 105    Temp 99.9 F (37.7 C) (Oral)    Resp 20    Ht 5\' 6"  (1.676 m)    Wt 94.8 kg    LMP 02/26/2021    BMI 33.73 kg/m  EFM: 145/moderate/+accels/one variable s/p AROM  CVE: Dilation: 5 Effacement (%): 70 Station: -2 Presentation: Vertex Exam by:: [email or web address], RN   A&P: 21 y.o. G1P0 [redacted]w[redacted]d here for IOL for gHTN #Labor: Progressing well. S/p AROM.  #Pain: Well controlled with epidural #FWB: Category 1 #GBS positive #gHTN   [redacted]w[redacted]d, Medical Student 10:26 AM    Attestation of Supervision of Student: I confirm that I have verified the information documented in the  resident  students note and that I have also personally reperformed the history, physical exam and all medical decision making activities.  I have verified that all services and findings are accurately documented in this student's note; and I agree with management and plan as outlined in the documentation. I have also made any necessary editorial changes.  Patient was AROM'd by me, moderate amount of clear/pink-tinged fluid. Patient and baby tolerated procedure well. Will continue Pitocin titration prn.  Judieth Keens, CNM 11/13/21 12:50 PM

## 2021-11-13 NOTE — Progress Notes (Signed)
Yesenia Clarke is a 21 y.o. G1P0 at [redacted]w[redacted]d admitted for induction of labor due to gHTN.  Subjective: Reports feeling chills. Objective: BP (!) 118/52    Pulse (!) 120    Temp (!) 101 F (38.3 C) (Axillary)    Resp 18    Ht 5\' 6"  (1.676 m)    Wt 94.8 kg    LMP 02/26/2021    BMI 33.73 kg/m  I/O last 3 completed shifts: In: -  Out: 4575 [Urine:4575] Total I/O In: -  Out: 1750 [Urine:1750]  FHT:  FHR: 150 bpm, variability: moderate,  accelerations:  Present,  decelerations:  Absent UC:   regular, every 1-3 minutes SVE:   Dilation: 5 Effacement (%): 70 Station: -1 Exam by:: [email or web address], RN  Labs: Lab Results  Component Value Date   WBC 6.7 11/12/2021   HGB 11.9 (L) 11/12/2021   HCT 36.1 11/12/2021   MCV 87.6 11/12/2021   PLT 186 11/12/2021    Assessment / Plan: admitted for induction of labor due to gHTN, now on day 2 of IOL, also developed Triple I  Labor: Cervix remains unchanged at 5 cm throughout day. Despite pitocin break and AROM. IUPC without adequate contractions. Pitocin at 12. Will uptitrate as tolerated. Once adequate will check cervix in 4 hours from adequate. gHTN:   BP normotensive to mild range. Continue to monitor Fetal Wellbeing:  Category I Pain Control:  Epidural I/D:   GBS pos> on PCN. Also now with Triple I. Treating with Amp and gent   01/10/2022 11/13/2021, 11:57 PM

## 2021-11-13 NOTE — Progress Notes (Addendum)
Yesenia Clarke is a 21 y.o. G1P0 at [redacted]w[redacted]d by LMP admitted for induction of labor due to Gestational Hypertension.  Subjective: CNM at bedside to assess patient's labor progress. Pt reports feeling well, no concerns at this time. Epidural is adequate. Cervical exam remains unchanged from prior; discussed placement of an IUPC for closer monitoring and titration of pitocin. Pt is understanding and agrees with plan.  Objective: BP 114/68    Pulse (!) 110    Temp 99.7 F (37.6 C) (Oral)    Resp 18    Ht 5\' 6"  (1.676 m)    Wt 94.8 kg    LMP 02/26/2021    BMI 33.73 kg/m  I/O last 3 completed shifts: In: -  Out: 1350 [Urine:1350] Total I/O In: -  Out: 1250 [Urine:1250]  FHT:  FHR: 140 bpm, variability: moderate,  accelerations:  Present,  decelerations:  Present Variable Decelerations, infrequent UC:   regular, every 2-3 minutes SVE:   Dilation: 5 Effacement (%): 70 Station: -2 Exam by:: [email or web address], RN  Labs: Lab Results  Component Value Date   WBC 6.7 11/12/2021   HGB 11.9 (L) 11/12/2021   HCT 36.1 11/12/2021   MCV 87.6 11/12/2021   PLT 186 11/12/2021    Assessment / Plan: Induction of labor due to gestational hypertension, on pitocin  Labor: Progressing normally, IUPC placed and will titrate pitocin appropriately Fetal Wellbeing:  Category I Pain Control:  Epidural I/D:  n/a Anticipated MOD:  NSVD  01/10/2022 11/13/2021, 1:52 PM   Attestation of CNM Supervision of Resident: Evaluation and management procedures were performed by the [facility] Medicine Resident under my supervision. I was immediately available for direct supervision, assistance and direction throughout this encounter.  I also confirm that I have verified the information documented in the residents note, and that I have also personally reperformed the pertinent components of the physical exam and all of the medical decision making activities.  I have also made any necessary editorial changes.  CNM placed IUPC.  Continue pitocin titration   [facility], CNM 11/13/2021 3:05 PM

## 2021-11-13 NOTE — Progress Notes (Signed)
Yesenia Clarke is a 21 y.o. G1P0 at [redacted]w[redacted]d by LMP admitted for induction of labor due to Gestational Hypertension.  Subjective: Comfortable with epidural  Objective: BP 115/71    Pulse (!) 118    Temp 100 F (37.8 C) (Axillary)    Resp 16    Ht 5\' 6"  (1.676 m)    Wt 94.8 kg    LMP 02/26/2021    BMI 33.73 kg/m  I/O last 3 completed shifts: In: -  Out: 4575 [Urine:4575] No intake/output data recorded.  FHT:  FHR: 140 bpm, variability: moderate,  accelerations:  Present,  decelerations:  Present Variable Decelerations, infrequent UC:   regular, every 2-3 minutes SVE:   Dilation: 5 Effacement (%): 70 Station: -1 Exam by:: [email or web address] RN  Labs: Lab Results  Component Value Date   WBC 6.7 11/12/2021   HGB 11.9 (L) 11/12/2021   HCT 36.1 11/12/2021   MCV 87.6 11/12/2021   PLT 186 11/12/2021    Assessment / Plan: Induction of labor due to gestational hypertension, on pitocin  Labor: Cervix unchanged. Will turn pitocin off x1 hour and restart. gHTN: normotensive, asymptomatic Fetal Wellbeing:  Category I Pain Control:  Epidural I/D:  n/a Anticipated MOD:  NSVD  01/10/2022, CNM 11/13/2021, 7:42 PM

## 2021-11-13 NOTE — Progress Notes (Signed)
Pharmacy Antibiotic Note  Yesenia Clarke is a 21 y.o. female admitted on 11/12/2021 for IOL at 100w1d due to gestational hypertension.  Pharmacy has been consulted for Gentamicin dosing due to Triple I/Chorioamnionitis  Plan: Gentamicin 370mg  (5mg /kg) IV q24h Will continue to follow.  Height: 5\' 6"  (167.6 cm) Weight: 94.8 kg (209 lb) IBW/kg (Calculated) : 59.3  Temp (24hrs), Avg:100.1 F (37.8 C), Min:99.6 F (37.6 C), Max:101 F (38.3 C)  Recent Labs  Lab 11/12/21 0840  WBC 6.7  CREATININE 0.71    Estimated Creatinine Clearance: 131.2 mL/min (by C-G formula based on SCr of 0.71 mg/dL).    No Known Allergies  Antimicrobials this admission: Penicillin protocol for GBS Ampicillin 2 gram IV q6h  1/3>>    Thank you for allowing pharmacy to be a part of this patients care.  11/13/2021 10:44 PM

## 2021-11-13 NOTE — Progress Notes (Signed)
Encouraged pt to change position. Pt declined.

## 2021-11-14 ENCOUNTER — Encounter (HOSPITAL_COMMUNITY): Payer: Self-pay | Admitting: Obstetrics and Gynecology

## 2021-11-14 DIAGNOSIS — O134 Gestational [pregnancy-induced] hypertension without significant proteinuria, complicating childbirth: Secondary | ICD-10-CM

## 2021-11-14 DIAGNOSIS — Z3A37 37 weeks gestation of pregnancy: Secondary | ICD-10-CM

## 2021-11-14 DIAGNOSIS — O99820 Streptococcus B carrier state complicating pregnancy: Secondary | ICD-10-CM

## 2021-11-14 MED ORDER — OXYCODONE HCL 5 MG PO TABS
10.0000 mg | ORAL_TABLET | ORAL | Status: DC | PRN
  Administered 2021-11-14: 10 mg via ORAL
  Filled 2021-11-14: qty 2

## 2021-11-14 MED ORDER — DIPHENOXYLATE-ATROPINE 2.5-0.025 MG PO TABS: 2.0000 | ORAL_TABLET | Freq: Once | ORAL | Status: DC

## 2021-11-14 MED ORDER — METHYLERGONOVINE MALEATE 0.2 MG/ML IJ SOLN: 0.2000 mg | Freq: Once | INTRAMUSCULAR | Status: DC

## 2021-11-14 MED ORDER — SIMETHICONE 80 MG PO CHEW: 80.0000 mg | CHEWABLE_TABLET | ORAL | Status: DC | PRN

## 2021-11-14 MED ORDER — OXYCODONE HCL 5 MG PO TABS: 5.0000 mg | ORAL_TABLET | ORAL | Status: DC | PRN

## 2021-11-14 MED ORDER — DIBUCAINE (PERIANAL) 1 % EX OINT: 1.0000 "application " | TOPICAL_OINTMENT | CUTANEOUS | Status: DC | PRN

## 2021-11-14 MED ORDER — PIPERACILLIN-TAZOBACTAM 4.5 G IVPB
4.5000 g | Freq: Three times a day (TID) | INTRAVENOUS | Status: DC
  Filled 2021-11-14 (×2): qty 100

## 2021-11-14 MED ORDER — CARBOPROST TROMETHAMINE 250 MCG/ML IM SOLN
INTRAMUSCULAR | Status: AC
  Administered 2021-11-14: 250 ug
  Filled 2021-11-14: qty 1

## 2021-11-14 MED ORDER — WITCH HAZEL-GLYCERIN EX PADS: 1.0000 "application " | MEDICATED_PAD | CUTANEOUS | Status: DC | PRN

## 2021-11-14 MED ORDER — IBUPROFEN 600 MG PO TABS
600.0000 mg | ORAL_TABLET | Freq: Four times a day (QID) | ORAL | Status: DC
  Administered 2021-11-14 – 2021-11-16 (×7): 600 mg via ORAL
  Filled 2021-11-14 (×7): qty 1

## 2021-11-14 MED ORDER — TRANEXAMIC ACID-NACL 1000-0.7 MG/100ML-% IV SOLN
INTRAVENOUS | Status: AC
  Administered 2021-11-14: 1000 mg
  Filled 2021-11-14: qty 100

## 2021-11-14 MED ORDER — PIPERACILLIN-TAZOBACTAM 3.375 G IVPB
3.3750 g | Freq: Three times a day (TID) | INTRAVENOUS | Status: DC
  Administered 2021-11-14: 3.375 g via INTRAVENOUS
  Filled 2021-11-14 (×5): qty 50

## 2021-11-14 MED ORDER — OXYCODONE HCL 5 MG PO TABS: 10.0000 mg | ORAL_TABLET | ORAL | Status: DC | PRN

## 2021-11-14 MED ORDER — DIPHENHYDRAMINE HCL 25 MG PO CAPS: 25.0000 mg | ORAL_CAPSULE | Freq: Four times a day (QID) | ORAL | Status: DC | PRN

## 2021-11-14 MED ORDER — PIPERACILLIN-TAZOBACTAM 3.375 G IVPB
3.3750 g | Freq: Three times a day (TID) | INTRAVENOUS | Status: AC
  Administered 2021-11-14: 23:00:00 3.375 g via INTRAVENOUS
  Filled 2021-11-14 (×2): qty 50

## 2021-11-14 MED ORDER — METHYLERGONOVINE MALEATE 0.2 MG/ML IJ SOLN
INTRAMUSCULAR | Status: AC
  Administered 2021-11-14: 0.2 mg
  Filled 2021-11-14: qty 1

## 2021-11-14 MED ORDER — ACETAMINOPHEN 325 MG PO TABS
650.0000 mg | ORAL_TABLET | ORAL | Status: DC | PRN
  Administered 2021-11-14 – 2021-11-15 (×2): 650 mg via ORAL
  Filled 2021-11-14 (×2): qty 2

## 2021-11-14 MED ORDER — CARBOPROST TROMETHAMINE 250 MCG/ML IM SOLN: 250.0000 ug | Freq: Once | INTRAMUSCULAR | Status: DC

## 2021-11-14 MED ORDER — ONDANSETRON HCL 4 MG PO TABS: 4.0000 mg | ORAL_TABLET | ORAL | Status: DC | PRN

## 2021-11-14 MED ORDER — KETOROLAC TROMETHAMINE 30 MG/ML IJ SOLN
30.0000 mg | Freq: Once | INTRAMUSCULAR | Status: AC
Start: 2021-11-14 — End: 2021-11-14
  Administered 2021-11-14: 30 mg via INTRAVENOUS
  Filled 2021-11-14: qty 1

## 2021-11-14 MED ORDER — PRENATAL MULTIVITAMIN CH
1.0000 | ORAL_TABLET | Freq: Every day | ORAL | Status: DC
  Administered 2021-11-15 – 2021-11-16 (×2): 1 via ORAL
  Filled 2021-11-14 (×2): qty 1

## 2021-11-14 MED ORDER — COCONUT OIL OIL: 1.0000 "application " | TOPICAL_OIL | Status: DC | PRN

## 2021-11-14 MED ORDER — MISOPROSTOL 200 MCG PO TABS
ORAL_TABLET | ORAL | Status: AC
  Filled 2021-11-14: qty 1

## 2021-11-14 MED ORDER — TETANUS-DIPHTH-ACELL PERTUSSIS 5-2.5-18.5 LF-MCG/0.5 IM SUSY
0.5000 mL | PREFILLED_SYRINGE | Freq: Once | INTRAMUSCULAR | Status: AC
  Administered 2021-11-16: 0.5 mL via INTRAMUSCULAR
  Filled 2021-11-14: qty 0.5

## 2021-11-14 MED ORDER — SENNOSIDES-DOCUSATE SODIUM 8.6-50 MG PO TABS
2.0000 | ORAL_TABLET | Freq: Every day | ORAL | Status: DC
  Administered 2021-11-15 – 2021-11-16 (×2): 2 via ORAL
  Filled 2021-11-14 (×2): qty 2

## 2021-11-14 MED ORDER — ONDANSETRON HCL 4 MG/2ML IJ SOLN: 4.0000 mg | INTRAMUSCULAR | Status: DC | PRN

## 2021-11-14 MED ORDER — BENZOCAINE-MENTHOL 20-0.5 % EX AERO: 1.0000 "application " | INHALATION_SPRAY | CUTANEOUS | Status: DC | PRN

## 2021-11-14 MED ORDER — ZOLPIDEM TARTRATE 5 MG PO TABS: 5.0000 mg | ORAL_TABLET | Freq: Every evening | ORAL | Status: DC | PRN

## 2021-11-14 MED ORDER — TRANEXAMIC ACID-NACL 1000-0.7 MG/100ML-% IV SOLN
1000.0000 mg | INTRAVENOUS | Status: AC
  Administered 2021-11-14: 1000 mg via INTRAVENOUS
  Filled 2021-11-14: qty 100

## 2021-11-14 MED ORDER — MISOPROSTOL 200 MCG PO TABS
800.0000 ug | ORAL_TABLET | Freq: Once | ORAL | Status: AC
  Administered 2021-11-14: 800 ug via RECTAL

## 2021-11-14 NOTE — Progress Notes (Signed)
Patient with another fever, now 102.3. Received Amp and Gent overnight       Objective: BP (!) 121/56    Pulse (!) 126    Temp (!) 102.3 F (39.1 C) (Axillary)    Resp 18    Ht 5\' 6"  (1.676 m)    Wt 94.8 kg    LMP 02/26/2021    BMI 33.73 kg/m  I/O last 3 completed shifts: In: -  Out: 4575 [Urine:4575] Total I/O In: -  Out: 2600 [Urine:2600]  FHT:  FHR: 150 bpm, variability: moderate,  accelerations:  Abscent,  decelerations:  Absent UC:   irregular, every 1-3 minutes SVE:   Dilation: 5 Effacement (%): 70 Station: -1 Exam by:: Dr. [email or web address]  Labs: Lab Results  Component Value Date   WBC 6.7 11/12/2021   HGB 11.9 (L) 11/12/2021   HCT 36.1 11/12/2021   MCV 87.6 11/12/2021   PLT 186 11/12/2021    Assessment / Plan: Upon discussion with pharmacy will now switch to Zosyn.  Labor: plan to check cervix once adequate contractions for 4 hours or if otherwise clinically indicated. Will continue to monitor for fetal tachycardia and or worsening tracing to guide if needs to be checked sooner.   01/10/2022 11/14/2021, 5:55 AM

## 2021-11-14 NOTE — Discharge Summary (Signed)
Postpartum Discharge Summary      Patient Name: Yesenia Clarke DOB: [birth year] MRN: [id number]  Date of admission: 11/12/2021 Delivery date:11/14/2021  Delivering provider: Fatima Blank A  Date of discharge: 11/16/2021  Admitting diagnosis: Gestational hypertension [O13.9] Intrauterine pregnancy: [redacted]w[redacted]d    Secondary diagnosis:  Principal Problem:   Gestational hypertension  Additional problems: Postpartum hemorrhage    Discharge diagnosis: Term Pregnancy Delivered                                              Post partum procedures: [place] for [place]: AROM, Pitocin, Cytotec, and IP Foley Complications: [id number] Hospital course: Induction of Labor With Vaginal Delivery   21y.o. yo G1P0 at 362w2das admitted to the hospital 11/12/2021 for induction of labor.  Indication for induction: Gestational hypertension.  Patient had an uncomplicated labor course as follows: Membrane Rupture Time/Date: 10:26 AM ,11/13/2021   Delivery Method:Vaginal, Spontaneous  Episiotomy: None  Lacerations:    Details of delivery can be found in separate delivery note.  Patient had a routine postpartum course. Patient is discharged home 11/16/21.  Newborn Data: Birth date:11/14/2021  Birth time:1:23 PM  Gender:Female  Living status:Living  Apgars:1 ,5  Weight:3250 g   Magnesium Sulfate received: No BMZ received: No Rhophylac:No MMR:No T-DaP: declined Flu: No Transfusion:No  Physical exam  Vitals:   11/15/21 1008 11/15/21 1348 11/15/21 1942 11/16/21 0518  BP: 124/66 116/62 (!) 101/58 (!) 107/55  Pulse: 83 82 93 73  Resp: 16 16 18 18   Temp: 98.4 F (36.9 C) 97.7 F (36.5 C) 97.7 F (36.5 C) 97.8 F (36.6 C)  TempSrc: Oral  Oral Oral  SpO2: 98%  97% 100%  Weight:      Height:       General: alert, cooperative, and no distress Lochia: appropriate Uterine Fundus: firm Incision: N/A DVT Evaluation: No evidence of DVT seen on physical exam. Negative Homan's sign. No  cords or calf tenderness. No significant calf/ankle edema. Labs: Lab Results  Component Value Date   WBC 13.4 (H) 11/15/2021   HGB 9.5 (L) 11/15/2021   HCT 27.9 (L) 11/15/2021   MCV 87.7 11/15/2021   PLT 146 (L) 11/15/2021   CMP Latest Ref Rng & Units 11/12/2021  Glucose 70 - 99 mg/dL 103(H)  BUN 6 - 20 mg/dL <5(L)  Creatinine 0.44 - 1.00 mg/dL 0.71  Sodium 135 - 145 mmol/L 137  Potassium 3.5 - 5.1 mmol/L 3.7  Chloride 98 - 111 mmol/L 105  CO2 22 - 32 mmol/L 20(L)  Calcium 8.9 - 10.3 mg/dL 9.5  Total Protein 6.5 - 8.1 g/dL 7.1  Total Bilirubin 0.3 - 1.2 mg/dL 1.1  Alkaline Phos 38 - 126 U/L 154(H)  AST 15 - 41 U/L 20  ALT 0 - 44 U/L 19   Edinburgh Score: Edinburgh Postnatal Depression Scale Screening Tool 11/16/2021  I have been able to laugh and see the funny side of things. 0  I have looked forward with enjoyment to things. 0  I have blamed myself unnecessarily when things went wrong. 0  I have been anxious or worried for no good reason. 1  I have felt scared or panicky for no good reason. 1  Things have been getting on top of me. 0  I have been so unhappy that I have had difficulty sleeping. 0  I have felt sad or miserable. 0  I have been so unhappy that I have been crying. 0  The thought of harming myself has occurred to me. 0  Edinburgh Postnatal Depression Scale Total 2     After visit meds:  Allergies as of 11/16/2021   No Known Allergies      Medication List     STOP taking these medications    aspirin 81 MG chewable tablet       TAKE these medications    Blood Pressure Kit Devi 1 kit by Does not apply route once a week.   ibuprofen 600 MG tablet Commonly known as: ADVIL Take 1 tablet (600 mg total) by mouth every 6 (six) hours.   Iron (Ferrous Sulfate) 325 (65 Fe) MG Tabs Take 1 tablet by mouth every other day. What changed:  how much to take when to take this   Vitafol Ultra 29-0.6-0.4-200 MG Caps Take 1 capsule by mouth daily before  breakfast.         Discharge home in stable condition Infant Feeding: Bottle and Breast Infant Disposition:home with mother Discharge instruction: per After Visit Summary and Postpartum booklet. Activity: Advance as tolerated. Pelvic rest for 6 weeks.  Diet: routine diet Future Appointments: Future Appointments  Date Time Provider [place]  11/21/2021 11:00 AM [place] None  12/20/2021 10:55 AM Radene Gunning, MD [place] None   Follow up Visit:  Message sent to [place] on 11/14/21: Please schedule this patient for a In person postpartum visit in 6 weeks with the following provider: Any provider. Additional Postpartum F/U:BP check 1 week  High risk pregnancy complicated by: HTN Delivery mode:  Vaginal, Spontaneous  Anticipated Birth Control:  Nexplanon -- office updated on change in [facility] choice   11/16/2021 Laury Deep, CNM

## 2021-11-14 NOTE — Lactation Note (Signed)
Lactation Consultation Note  Patient Name: Yesenia Clarke [id number] Date: 11/14/2021  Lactation noted delivery in L&D and called main desk to ask if patient would like assistance with latching. I was forwarded to the RN, and she stated that the patient would like to be seen, but at this time they were doing some procedures. I asked if she could call when ready for lactation, and RN indicated that she would. Lactation order is not present in lactation grease board (the field is blank).   Age:21 y.o.   Lenore Manner 11/14/2021, 3:23 PM

## 2021-11-14 NOTE — Progress Notes (Signed)
Yesenia Clarke is a 21 y.o. G1P0 at [redacted]w[redacted]d  induction of labor due to gHTN.   Objective: BP 116/70    Pulse (!) 124    Temp 99 F (37.2 C) (Axillary)    Resp 18    Ht 5\' 6"  (1.676 m)    Wt 94.8 kg    LMP 02/26/2021    BMI 33.73 kg/m  I/O last 3 completed shifts: In: -  Out: 4575 [Urine:4575] Total I/O In: -  Out: 1750 [Urine:1750]  FHT:  FHR: 150 bpm, variability: moderate,  accelerations:  Present,  decelerations:  Absent UC:   irregular, every 1-4 minutes SVE:   Dilation: 5 Effacement (%): 70 Station: -1 Exam by:: Dr. Cy Blamer  Labs: Lab Results  Component Value Date   WBC 6.7 11/12/2021   HGB 11.9 (L) 11/12/2021   HCT 36.1 11/12/2021   MCV 87.6 11/12/2021   PLT 186 11/12/2021    Assessment / Plan:  induction of labor due to gHTN, now on day 2 of IOL, also developed Triple I  Labor: cervix unchanged and continues to be 4-5 cm on my check. IUPC in place and MVUs not adequate yet. Concern for IUPC being dysfunctional. Switched out IUPC. Will uptitrate pitocin as appropriate and recheck in 4 hrs. If unchanged cervix will discuss regarding potential arrest of dilation and mode of delivery. Discussed possibility of cesarean section if arrest of dilation with patient. Discussed that at this time will continue uptitrating pitocin in hopes of achieving adequate MVUs. Patient expressed understanding (although was tearful when we discussed that if continues to be unchanged we would discuss Cesarean delivery). gHTN:   normotensive at last check. Continue to monitor Fetal Wellbeing:  Category I Pain Control:  Epidural I/D:   GBS pos, triple I, amp and gent, Temp improved to 99 (from 101)   Renard Matter 11/14/2021, 2:19 AM

## 2021-11-14 NOTE — Progress Notes (Signed)
Yesenia Clarke is a 21 y.o. G1P0 at [redacted]w[redacted]d by admitted for induction of labor due to [facility].  Subjective: Pt comfortable with epidural.  Family in room for support.  Objective: BP 122/67    Pulse (!) 132    Temp (!) 101.6 F (38.7 C) (Axillary)    Resp 18    Ht 5\' 6"  (1.676 m)    Wt 94.8 kg    LMP 02/26/2021    BMI 33.73 kg/m  I/O last 3 completed shifts: In: -  Out: 7175 [Urine:7175] Total I/O In: -  Out: 1450 [Urine:1450]  FHT:  FHR: 135 bpm, variability: moderate with periods of minimal,  accelerations:  Abscent,  decelerations:  Absent UC:   regular, every 1-2 minutes SVE:   Dilation: 10 Effacement (%): 100 Station: Plus 2 Exam by:: leftwich-kirby, cnm  Labs: Lab Results  Component Value Date   WBC 6.7 11/12/2021   HGB 11.9 (L) 11/12/2021   HCT 36.1 11/12/2021   MCV 87.6 11/12/2021   PLT 186 11/12/2021    Assessment / Plan: Induction of labor due to [facility],  progressing well on pitocin. Pt to start pushing with RN.  Labor: Progressing normally Preeclampsia:  labs stable Fetal Wellbeing:  Category I Pain Control:  Epidural I/D:   GBS positive on PCN Anticipated MOD:  NSVD  [facility] 11/14/2021, 12:46 PM

## 2021-11-14 NOTE — Progress Notes (Signed)
Pharmacy Antibiotic Note  Yesenia Clarke is a 21 y.o. female admitted on 11/12/2021 with  chorioamnionitis .  Pharmacy has been consulted for zosyn dosing.  Plan: Patient was on ampicillin and gentamicin and consistently febrile. It was decided to switch to Zosyn 3.375g IV q8h (4 hour infusion).  Height: 5\' 6"  (167.6 cm) Weight: 94.8 kg (209 lb) IBW/kg (Calculated) : 59.3  Temp (24hrs), Avg:100.3 F (37.9 C), Min:99 F (37.2 C), Max:102.3 F (39.1 C)  Recent Labs  Lab 11/12/21 0840  WBC 6.7  CREATININE 0.71    Estimated Creatinine Clearance: 131.2 mL/min (by C-G formula based on SCr of 0.71 mg/dL).    No Known Allergies  Antimicrobials this admission: Ampicillin + gentamicin >> Zosyn  Thank you for allowing pharmacy to be a part of this patients care.  01/10/22 Maxim Bedel 11/14/2021 5:28 AM

## 2021-11-14 NOTE — Progress Notes (Signed)
S: Pt comfortable, family in room for support.   O: BP 137/86    Pulse 84    Temp 99.1 F (37.3 C) (Oral)    Resp 18    Ht 5\' 6"  (1.676 m)    Wt 94.8 kg    LMP 02/26/2021    BMI 33.73 kg/m   VS reviewed, nursing note reviewed,  Constitutional: well developed, well nourished, no distress HEENT: normocephalic CV: normal rate Pulm/chest wall: normal effort Abdomen: soft Neuro: alert and oriented x 3 Skin: warm, dry Psych: affect normal  Vaginal bleeding minimal, fundal exam at umbilicus, firm.  02/28/2021 removed without difficulty (deflated and suction off 30+ minutes ago)  A: S/P SVD with shoulder dystocia and PPH  P:  Pt to transfer to MB.  Continue to monitor bleeding  Mel Almond, CNM 5:36 PM

## 2021-11-15 LAB — CBC
HCT: 27.9 % — ABNORMAL LOW (ref 36.0–46.0)
Hemoglobin: 9.5 g/dL — ABNORMAL LOW (ref 12.0–15.0)
MCH: 29.9 pg (ref 26.0–34.0)
MCHC: 34.1 g/dL (ref 30.0–36.0)
MCV: 87.7 fL (ref 80.0–100.0)
Platelets: 146 10*3/uL — ABNORMAL LOW (ref 150–400)
RBC: 3.18 MIL/uL — ABNORMAL LOW (ref 3.87–5.11)
RDW: 14.4 % (ref 11.5–15.5)
WBC: 13.4 10*3/uL — ABNORMAL HIGH (ref 4.0–10.5)
nRBC: 0 % (ref 0.0–0.2)

## 2021-11-15 MED ORDER — NIFEDIPINE ER OSMOTIC RELEASE 30 MG PO TB24
30.0000 mg | ORAL_TABLET | Freq: Every day | ORAL | Status: DC
  Filled 2021-11-15: qty 1

## 2021-11-15 NOTE — Social Work (Addendum)
CSW received consult for hx of ADHD, Anxiety and Depression.  CSW met with [name] to offer support and complete assessment.    CSW met with [name] at bedside and introduced CSW role. CSW observed [name] sitting on bed, maternal grandmother at bedside, and maternal uncle holding the infant. [name] presented calm and receptive to [place] visit. CSW offered [name] privacy. [name] welcomed CSW to complete the assessment with her family present. CSW inquired how [name] has felt since giving birth. [name] reported feeling tired but good. [name] shared that the birth experience was "very scary when the baby came out not breathing and I was bleeding out." CSW validated [name] feelings and offered support. CSW inquired how [name] felt during the pregnancy. [name] reported she felt good during the pregnancy with no concerns. CSW inquired about [name] mental health history. [name] reported history of ADHD. Maternal grandmother interjected and stated that [name] has history of ADHD and the medications that she was taking for the ADHD symptoms caused [name] to have anxiety and depression as a child (2014). Maternal grandmother shared that [name] was diagnosed with ADHD in kindergarten and depression and anxiety in fifth grade. When [name] stopped taking the ADHD medication in high school she thrived and has done well emotionally since then, " it was as if she channeled her energy in school and activities." [name] agreed with maternal grandmother. CSW inquired about [name] coping skills. [name] reported she "zones out with listening to music and staying active at work." CSW encouraged [name] to continue using healthy coping skills and doing things that make her happy. CSW inquired about [name] supports. [name] identified her mom, FOB, brother, and family as supports.   CSW provided education regarding the baby blues period vs. perinatal mood disorders, discussed treatment and gave resources for mental health follow up if concerns arise. CSW recommended [name] complete a self-evaluation during the  postpartum time period using the New Mom Checklist from Postpartum Progress and encouraged [name] to contact a medical professional if symptoms are noted at any time.  [name] denied SI/HI/DV.   [name] reported she has essential items for the infant including a bassinet where the infant will sleep. CSW provided review of Sudden Infant Death Syndrome (SIDS) precautions. [name] reported understanding. CSW discussed [facility] services and offered to make a referral. [name] and Maternal grandmother expressed great interest in the program. [name] gave CSW permission to make a referral. [name] has chosen [place] for infant's follow up care. CSW assessed [name] for additional needs. [name] reported no further needs.   CSW identifies no further need for intervention and no barriers to discharge at this time.   Yesenia Clarke, MSW, LCSW Women's and [place] Worker  [phone] 11/15/2021  10:54 AM

## 2021-11-15 NOTE — Progress Notes (Addendum)
Post Partum Day 1 Subjective: No significant complaints. Denies fevers/chills overnight. States that she has not yet breastfed but plans to do so following evaluation by lactation today. Denies any ongoing N/V. No reported cp, SOB, LE swelling or pain. Currently enjoying time with baby in her arms. States that she has been having "hot flashes" although also reports having used a large warmer/heating pad on her belly to help with discomfort. Endorses good pain control on current medication regimen. Normal PO, voiding and having flatus.  Objective: Blood pressure 125/63, pulse 86, temperature 98.6 F (37 C), temperature source Oral, resp. rate 19, height 5\' 6"  (1.676 m), weight 94.8 kg, last menstrual period 02/26/2021, SpO2 96 %.  Physical Exam:  General: alert, cooperative, appears stated age, and no distress Lochia: appropriate Uterine Fundus: firm Incision: N/A DVT Evaluation: No evidence of DVT seen on physical exam. Negative Homan's sign. No cords or calf tenderness.  Recent Labs    11/12/21 0840 11/15/21 0617  HGB 11.9* 9.5*  HCT 36.1 27.9*    Assessment/Plan: #S/p SVD: Continue routine postpartum care. Lactation consult. Contraception plan for POPs.  #Chorioamnionitis: Afebrile since 1630 on 1/4. On Zosyn. Will remain on IV antibiotics until 24 hrs post-delivery. #gHTN with SIPE: Patient with some elevated Bps to 140s systolic, 110s diastolic during pregnancy/labor. Post partum BP with more than 3 readings >130s or >80s.  Will start Procardia 30 mg. Continue to monitor.  Plan for discharge tomorrow on PPD#2   LOS: 3 days   3/4 11/15/2021, 8:36 AM

## 2021-11-15 NOTE — Progress Notes (Signed)
RN called MD Gildardo Pounds for clarification on BP medication and get parameters. Pt's Bps since coming to [facility] have all been 120s/60s, a decrease from L&D where the BP med was originally ordered. Per MD, hold this dose and continue to monitor Bps to decide if medication needs d/c's  or not. RN verbalized understanding and acknowledgement.

## 2021-11-15 NOTE — Lactation Note (Signed)
This note was copied from a baby's chart. Lactation Consultation Note  Patient Name: Yesenia Clarke [id number] Date: 11/15/2021 Reason for consult: Follow-up assessment;Early term 37-38.6wks;Primapara;1st time breastfeeding Age:20 hours   P1 mother whose infant is now 82 hours old.  This is an ETI at 37+2 weeks.  Mother's current feeding preference is breast/formula.  Baby "Ulani" beginning to awaken.  Taught hand expression and mother able to express 2 drops of colostrum which I finger fed to baby.  Assisted to latch easily in the football hold.  Once at the breast, "Ulani" took a few sucks and pushed back off the breast.  Repeated multiple times with a few sucks each time.  She demonstrates a wide gape and flanged lips.  Stimulation needed to initiate a suck.  After 5 minutes, demonstrated paced bottle feeding and "Ulani" easily consumed 15 mls of formula using the white Nfant nipple.  Demonstrated effective burping and educated on nipple use.  Offered to set up the DEBP and mother receptive.  Pump parts, assembly and cleaning reviewed.  Wash station set up.  #24 flanges appropriate at this time.  Left room with mother pumping; no drops noted at this time.  Father, grandmother and great grandmother all present and receptive to teaching.  Questions answered.  RN updated.   Maternal Data Has patient been taught Hand Expression?: Yes Does the patient have breastfeeding experience prior to this delivery?: No  Feeding Mother's Current Feeding Choice: Breast Milk and Formula  LATCH Score Latch: Repeated attempts needed to sustain latch, nipple held in mouth throughout feeding, stimulation needed to elicit sucking reflex.  Audible Swallowing: None  Type of Nipple: Everted at rest and after stimulation  Comfort (Breast/Nipple): Soft / non-tender  Hold (Positioning): Assistance needed to correctly position infant at breast and maintain latch.  LATCH Score: 6   Lactation Tools  Discussed/Used Tools: Pump;Flanges Flange Size: 24 Breast pump type: Double-Electric Breast Pump;Manual Pump Education: Setup, frequency, and cleaning;Milk Storage Reason for Pumping: Breast stimulation for supplementation Pumping frequency: Every three hours  Interventions Interventions: Breast feeding basics reviewed;Assisted with latch;Skin to skin;Breast massage;Hand express;Breast compression;Adjust position;DEBP;Hand pump;Position options;Support pillows;Education;LC Services brochure;Pace feeding  Discharge Pump: DEBP;Manual;Personal WIC Program: No  Consult Status Consult Status: Follow-up Date: 11/16/21 Follow-up type: In-patient    [name] R Denny Lave 11/15/2021, 12:01 PM

## 2021-11-15 NOTE — Lactation Note (Signed)
This note was copied from a baby's chart. Lactation Consultation Note  Patient Name: Yesenia Clarke [id number] Date: 11/15/2021 Reason for consult: Initial assessment;Early term 37-38.6wks;Primapara;1st time breastfeeding Age:20 hours   P1 mother whose infant is now 41 hours old.  This is an ETI at 37+2 weeks.  Mother's current feeding preference is breast/formula.  Since birth, "Yesenia Clarke" has only formula fed.  "Yesenia Clarke" was swaddled and asleep in father's arms when I arrived.  She last fed 2-1/2 hours ago.  Offered to return for the next feeding to educate regarding breast feeding.  Mother receptive and will call for my assistance.  Grandmother present.  RN updated.  Comprehensive amount of education will be done at the next visit.   Maternal Data Has patient been taught Hand Expression?: No (Will review when I return to assist with breast feeding) Does the patient have breastfeeding experience prior to this delivery?: No  Feeding Mother's Current Feeding Choice: Breast Milk and Formula  LATCH Score                    Lactation Tools Discussed/Used    Interventions    Discharge    Consult Status Consult Status: Follow-up Date: 11/15/21 Follow-up type: In-patient    Lanay Zinda R Ronie Fleeger 11/15/2021, 10:30 AM

## 2021-11-15 NOTE — Anesthesia Postprocedure Evaluation (Signed)
Anesthesia Post Note  Patient: Yesenia Clarke  Procedure(s) Performed: AN AD HOC LABOR EPIDURAL     Patient location during evaluation: Mother Baby Anesthesia Type: Epidural Level of consciousness: awake and alert Pain management: pain level controlled Vital Signs Assessment: post-procedure vital signs reviewed and stable Respiratory status: spontaneous breathing, nonlabored ventilation and respiratory function stable Cardiovascular status: stable Postop Assessment: no headache, no backache and epidural receding Anesthetic complications: no   No notable events documented.  Last Vitals:  Vitals:   11/14/21 2330 11/15/21 0330  BP:  125/63  Pulse:  86  Resp:  19  Temp: 36.8 C 37 C  SpO2:  96%    Last Pain:  Vitals:   11/15/21 0330  TempSrc: Oral  PainSc: 4                  Edmon Magid

## 2021-11-16 MED ORDER — IBUPROFEN 600 MG PO TABS: 600.0000 mg | ORAL_TABLET | Freq: Four times a day (QID) | ORAL | 0 refills | Status: DC

## 2021-11-16 NOTE — Lactation Note (Signed)
This note was copied from a baby's chart. Lactation Consultation Note  Patient Name: Yesenia Clarke [id number] Date: 11/16/2021 Reason for consult: Follow-up assessment;Early term 37-38.6wks;Primapara;1st time breastfeeding Age:20 hours   P1 mother whose infant is now 80 hours old.  This is an ETI at 37+2 weeks.  Mother's current feeding preference is breast/formula.  Mother has only been providing formula to "Yesenia Clarke" the last 24 hours.  When questioned about her continued interest in breast feeding, mother stated that she still wants to "try" breast feeding.  "Yesenia Clarke" just finished feeding 35 mls of formula per family member.  Discussed feeding plan for after discharge.  Suggested mother continue to put "Yesenia Clarke" to the breast at every feeding prior to giving formula supplementation.  Encouragement given and emphasized the importance of frequent latching so "Yesenia Clarke" can learn to breast feed.  She has been consuming appropriate amounts of formula.  Mother has been pumping with the DEBP and last obtained 5 mls which she fed to "Yesenia Clarke."  Reminded her to continue pumping after every breast feeding session to encourage a full milk supply.  Provided OP LC information and our OP phone number for any further questions/concerns related to breast feeding.  Father, grandmother and great grandmother present and supportive.    Family is awaiting another jaundice level at 1400 and are hoping for a discharge later this afternoon.  RN updated on mother's interest with breast feeding.   Maternal Data    Feeding Mother's Current Feeding Choice: Breast Milk and Formula  LATCH Score                    Lactation Tools Discussed/Used    Interventions Interventions: Breast feeding basics reviewed;Education  Discharge Discharge Education: Engorgement and breast care;Outpatient recommendation Pump: Personal;Manual;DEBP  Consult Status Consult Status: Complete Date: 11/16/21 Follow-up type: Call as  needed    Brooke Steinhilber R Katalyn Matin 11/16/2021, 11:36 AM

## 2021-11-21 ENCOUNTER — Other Ambulatory Visit: Payer: Self-pay

## 2021-11-21 ENCOUNTER — Ambulatory Visit (INDEPENDENT_AMBULATORY_CARE_PROVIDER_SITE_OTHER): Payer: Medicaid Other | Admitting: *Deleted

## 2021-11-21 VITALS — BP 123/80 | HR 85

## 2021-11-21 DIAGNOSIS — O133 Gestational [pregnancy-induced] hypertension without significant proteinuria, third trimester: Secondary | ICD-10-CM

## 2021-11-21 MED ORDER — AMLODIPINE BESYLATE 5 MG PO TABS: 5.0000 mg | ORAL_TABLET | Freq: Every day | ORAL | 0 refills | Status: DC

## 2021-11-21 NOTE — Progress Notes (Signed)
Subjective:  Yesenia Clarke is a 21 y.o. female here for BP check.   Hypertension ROS: home BP monitoring in range of 130-140's systolic over 80-90's diastolic, no TIA's, no chest pain on exertion, no dyspnea on exertion, and no swelling of ankles.    Objective:  BP 123/80    Pulse 85    LMP 02/26/2021   Appearance alert, well appearing, and in no distress and oriented to person, place, and time. General exam BP noted to be well controlled today in office.    Assessment:   Blood Pressure borderline controlled.   Plan:  Orders and follow up as documented in patient record. Follow up: 1 week and as needed. RX sent for Norvasc 5 mg and patient scheduled to return for BP check in 1 week per Dr. Donavan Foil.

## 2021-11-28 ENCOUNTER — Telehealth (HOSPITAL_COMMUNITY): Payer: Self-pay | Admitting: *Deleted

## 2021-11-28 ENCOUNTER — Ambulatory Visit: Payer: Medicaid Other

## 2021-11-28 NOTE — Telephone Encounter (Signed)
Phone voicemail message left to return nurse call.  Odis Hollingshead, RN 11-28-2021 at 2:39pm

## 2021-12-20 ENCOUNTER — Encounter: Payer: Self-pay | Admitting: Obstetrics and Gynecology

## 2021-12-20 ENCOUNTER — Other Ambulatory Visit: Payer: Self-pay

## 2021-12-20 ENCOUNTER — Ambulatory Visit (INDEPENDENT_AMBULATORY_CARE_PROVIDER_SITE_OTHER): Payer: Medicaid Other | Admitting: Obstetrics and Gynecology

## 2021-12-20 DIAGNOSIS — Z8759 Personal history of other complications of pregnancy, childbirth and the puerperium: Secondary | ICD-10-CM | POA: Diagnosis not present

## 2021-12-20 NOTE — Progress Notes (Signed)
[facility] Partum Visit Note  Yesenia Clarke is a 21 y.o. G1P0 female who presents for a postpartum visit. She is 4 weeks postpartum following a normal spontaneous vaginal delivery.  I have fully reviewed the prenatal and intrapartum course. The delivery was at [redacted]w[redacted]d gestational weeks.  Anesthesia: epidural. Postpartum course has been. Baby is doing well. Baby is feeding by breast. Bleeding no bleeding. Bowel function is normal. Bladder function is normal. Patient is not sexually active. Contraception method is none. Postpartum depression screening: negative. EPDS= 0   The pregnancy intention screening data noted above was reviewed. Potential methods of contraception were discussed. She is undecided.    Edinburgh Postnatal Depression Scale - 12/20/21 1104       Edinburgh Postnatal Depression Scale:  In the Past 7 Days   I have been able to laugh and see the funny side of things. 0    I have looked forward with enjoyment to things. 0    I have blamed myself unnecessarily when things went wrong. 0    I have been anxious or worried for no good reason. 0    I have felt scared or panicky for no good reason. 0    Things have been getting on top of me. 0    I have been so unhappy that I have had difficulty sleeping. 0    I have felt sad or miserable. 0    I have been so unhappy that I have been crying. 0    The thought of harming myself has occurred to me. 0    Edinburgh Postnatal Depression Scale Total 0             Health Maintenance Due  Topic Date Due   COVID-19 Vaccine (1) Never done   HPV VACCINES (1 - 2-dose series) Never done   Hepatitis C Screening  Never done    The following portions of the patient's history were reviewed and updated as appropriate: allergies, current medications, past family history, past medical history, past social history, past surgical history, and problem list.  Review of Systems Pertinent items are noted in HPI.  Objective:  BP 114/76    Pulse 83     Wt 185 lb 14.4 oz (84.3 kg)    LMP 02/26/2021    Breastfeeding Yes    BMI 30.01 kg/m    General:  alert, cooperative, appears stated age, and no distress   Breasts:  not indicated  Lungs: clear to auscultation bilaterally  Heart:  regular rate and rhythm  Abdomen: soft, non-tender; bowel sounds normal; no masses,  no organomegaly   Wound N/a  GU exam:  not indicated       Assessment:    1. Shoulder dystocia, delivered - Reviewed dystocia (she also already knew) - discussed risk of recurrence. Discussed importance of informing other provider if ever moves/delivers elsewhere - Discussed option in future for 1LTCS if she preferred   2. History of gestational hypertension - She took ldASA this pregnancy. Reviewed would recommend for each pregnancy - Discussed can recur  3. Postpartum care and examination - See below   6wk postpartum exam.   Plan:   Essential components of care per ACOG recommendations:  1.  Mood and well being: Patient with negative depression screening today. Reviewed local resources for support.  - Patient tobacco use? No.   - hx of drug use? No.    2. Infant care and feeding:  -Patient currently breastmilk feeding? Yes. Reviewed  importance of draining breast regularly to support lactation.  -Social determinants of health (SDOH) reviewed in EPIC. No concerns  3. Sexuality, contraception and birth spacing - Patient does not want a pregnancy in the next year.  Desired family size is 3? children.  - Reviewed forms of contraception in tiered fashion. Patient desired  to consider her options  today.  She is not sexually active and wishes to wait some time before having another baby - when she is "older and married".  - Discussed birth spacing of 18 months and risks of short interval  4. Sleep and fatigue -Encouraged family/partner/community support of 4 hrs of uninterrupted sleep to help with mood and fatigue  5. Physical Recovery  - Discussed patients  delivery and complications. She describes her labor as mixed. - Patient had a Vaginal problems after delivery including shoulder dystocia . Patient had a 1st degree laceration. Perineal healing reviewed. Patient expressed understanding - Patient has urinary incontinence? No. - Patient is safe to resume physical and sexual activity  6.  Health Maintenance - HM due items addressed Yes -Breast Cancer screening indicated? No.   7. Chronic Disease/Pregnancy Condition follow up: None  - PCP follow up  Radene Gunning, [place] [facility], [place]

## 2022-04-15 ENCOUNTER — Emergency Department (HOSPITAL_BASED_OUTPATIENT_CLINIC_OR_DEPARTMENT_OTHER): Payer: Medicaid Other

## 2022-04-15 ENCOUNTER — Other Ambulatory Visit: Payer: Self-pay

## 2022-04-15 ENCOUNTER — Encounter (HOSPITAL_BASED_OUTPATIENT_CLINIC_OR_DEPARTMENT_OTHER): Payer: Self-pay | Admitting: *Deleted

## 2022-04-15 ENCOUNTER — Emergency Department (HOSPITAL_BASED_OUTPATIENT_CLINIC_OR_DEPARTMENT_OTHER)
Admission: EM | Admit: 2022-04-15 | Discharge: 2022-04-15 | Disposition: A | Discharge status: Home / Self Care | Payer: Medicaid Other | Attending: Emergency Medicine | Admitting: Emergency Medicine

## 2022-04-15 DIAGNOSIS — R1032 Left lower quadrant pain: Secondary | ICD-10-CM | POA: Insufficient documentation

## 2022-04-15 DIAGNOSIS — R109 Unspecified abdominal pain: Secondary | ICD-10-CM | POA: Diagnosis present

## 2022-04-15 LAB — URINALYSIS, ROUTINE W REFLEX MICROSCOPIC
Bilirubin Urine: NEGATIVE
Glucose, UA: NEGATIVE mg/dL
Hgb urine dipstick: NEGATIVE
Ketones, ur: NEGATIVE mg/dL
Nitrite: NEGATIVE
Protein, ur: NEGATIVE mg/dL
Specific Gravity, Urine: 1.016 (ref 1.005–1.030)
pH: 5.5 (ref 5.0–8.0)

## 2022-04-15 LAB — PREGNANCY, URINE: Preg Test, Ur: NEGATIVE

## 2022-04-15 NOTE — ED Notes (Signed)
Pt sent to restroom for urine sample.

## 2022-04-15 NOTE — ED Triage Notes (Addendum)
C/o of lower abd pain that started this morning. Has taken tylenol for pain about 40 min pta with no relief. Denies any n/v. Denies fevers. States pain is constant. Denies any urinary symptoms.

## 2022-04-15 NOTE — ED Provider Notes (Signed)
[facility] EMERGENCY DEPT Provider Note   CSN: [id number] Arrival date & time: 04/15/22  0636     History  Chief Complaint  Patient presents with   Abdominal Pain    Yesenia Clarke is a 21 y.o. female.  HPI  21 year old female who is approximately 6 months postpartum vaginal delivery presents emergency department with concern for left lower quadrant abdominal pain.  Patient states that started after she woke up this morning.  Was sudden onset, left lower quadrant.  Has been persistent since.  Waxing and waning in severity but never going away.  She took a dose of Tylenol without any relief.  She denies any associated symptoms including nausea, vomiting, diarrhea, dysuria, hematuria, vaginal bleeding, vaginal discharge.  Her postpartum course has been uncomplicated.  No fever.  Home Medications Prior to Admission medications   Medication Sig Start Date End Date Taking? Authorizing Provider  Blood Pressure Monitoring (BLOOD PRESSURE KIT) DEVI 1 kit by Does not apply route once a week. Patient not taking: Reported on 04/15/2022 08/14/21   Genia Del, MD      Allergies    Patient has no known allergies.    Review of Systems   Review of Systems  Constitutional:  Negative for fever.  Respiratory:  Negative for shortness of breath.   Cardiovascular:  Negative for chest pain.  Gastrointestinal:  Positive for abdominal pain. Negative for diarrhea, nausea and vomiting.  Genitourinary:  Negative for difficulty urinating, dysuria, flank pain, vaginal bleeding, vaginal discharge and vaginal pain.  Skin:  Negative for rash.  Neurological:  Negative for headaches.   Physical Exam Updated Vital Signs BP 101/63   Pulse 67   Temp 98.3 F (36.8 C)   Resp 12   Ht 5' 6"  (1.676 m)   Wt 86.2 kg   LMP 03/15/2022 (Approximate)   SpO2 98%   BMI 30.67 kg/m  Physical Exam Vitals and nursing note reviewed.  Constitutional:      General: She is not in acute distress.     Appearance: Normal appearance.  HENT:     Head: Normocephalic.     Mouth/Throat:     Mouth: Mucous membranes are moist.  Cardiovascular:     Rate and Rhythm: Normal rate.  Pulmonary:     Effort: Pulmonary effort is normal. No respiratory distress.  Abdominal:     Palpations: Abdomen is soft.     Tenderness: There is no abdominal tenderness. There is no guarding or rebound.  Skin:    General: Skin is warm.  Neurological:     Mental Status: She is alert and oriented to person, place, and time. Mental status is at baseline.  Psychiatric:        Mood and Affect: Mood normal.    ED Results / Procedures / Treatments   Labs (all labs ordered are listed, but only abnormal results are displayed) Labs Reviewed  URINALYSIS, ROUTINE W REFLEX MICROSCOPIC - Abnormal; Notable for the following components:      Result Value   APPearance HAZY (*)    Leukocytes,Ua TRACE (*)    Bacteria, UA FEW (*)    All other components within normal limits  PREGNANCY, URINE    EKG None  Radiology No results found.  Procedures Procedures    Medications Ordered in ED Medications - No data to display  ED Course/ Medical Decision Making/ A&P  Medical Decision Making Amount and/or Complexity of Data Reviewed Radiology: ordered.   21 year old female presents emergency department with lower abdominal/left lower quadrant abdominal pain that started this morning.  No other associated symptoms.  Vitals are normal on arrival.  Pregnancy test is negative, urinalysis unremarkable.  Abdominal exam is benign.  Denies any GU symptoms, pelvic not indicated.  Ultrasound shows small amount of free fluid as well as a small area in the left ovary that is most likely nonspecific versus hemorrhage.  This is most likely the source of the patient's symptoms.  Her pain is currently improved.  Instructed her to follow-up with her OB/GYN, repeat ultrasound.  Strict return to ED precautions.   Patient at this time appears safe and stable for discharge and close outpatient follow up. Discharge plan and strict return to ED precautions discussed, patient verbalizes understanding and agreement.        Final Clinical Impression(s) / ED Diagnoses Final diagnoses:  None    Rx / DC Orders ED Discharge Orders     None         Lorelle Gibbs, DO 04/15/22 1042

## 2022-04-15 NOTE — Discharge Instructions (Signed)
You have been seen and discharged from the emergency department.  Your work-up looks suspicious for an ovarian cyst that could have ruptured.  Please follow-up with your OB/GYN for repeat ultrasound and further care.  You may take Tylenol/ibuprofen as needed for pain control.  Over the next day you may take 1000 mg of Tylenol every 6-8 hours as well as 800 mg of ibuprofen every 6-8 hours. Reduce to 650mg  of Tylenol and 600  mg of Ibuprofen as needed after that. Stay well-hydrated.  If you have any worsening symptoms, vaginal discharge, vaginal bleeding, fevers or further concerns for your health please return to an emergency department for further evaluation.

## 2022-04-16 ENCOUNTER — Other Ambulatory Visit: Payer: Self-pay

## 2022-04-16 NOTE — Progress Notes (Signed)
Breast pump order faxed

## 2022-04-19 ENCOUNTER — Ambulatory Visit (INDEPENDENT_AMBULATORY_CARE_PROVIDER_SITE_OTHER): Payer: Medicaid Other | Admitting: Obstetrics and Gynecology

## 2022-04-19 ENCOUNTER — Encounter: Payer: Self-pay | Admitting: Obstetrics and Gynecology

## 2022-04-19 DIAGNOSIS — R102 Pelvic and perineal pain: Secondary | ICD-10-CM

## 2022-04-19 NOTE — Patient Instructions (Signed)
Ovarian Cyst  An ovarian cyst is a fluid-filled sac on an ovary. Most of these cysts go away on their own and are not cancer. Some cysts need treatment. What are the causes? Ovarian hyperstimulation syndrome. Some medicines may lead to this problem. Polycystic ovarian syndrome (PCOS). Problems with body chemicals (hormones) can lead to this condition. The normal menstrual cycle. What increases the risk? Being overweight or very overweight. Taking medicines to increase your chance of getting pregnant. Using some types of birth control. Smoking. What are the signs or symptoms? Many ovarian cysts do not cause symptoms. If you get symptoms, you may have: Pain or pressure in the area between the hip bones. Pain in the lower belly. Pain during sex. Swelling in the lower belly. Periods that are not regular. Pain with periods. How is this treated? Many ovarian cysts go away on their own without treatment. If you need treatment, it may include: Medicines for pain. Fluid taken out of the cyst. The cyst being taken out. Birth control pills or other medicines. Surgery to remove the ovary. Follow these instructions at home: Take over-the-counter and prescription medicines only as told by your doctor. Ask your doctor if you should avoid driving or using machines while you are taking your medicine. Get exams and Pap tests as told by your doctor. Return to your normal activities when your doctor says that it is safe. Do not smoke or use any products that contain nicotine or tobacco. If you need help quitting, ask your doctor. Keep all follow-up visits. Contact a doctor if: Your periods: Are late. Are not regular. Stop. Are painful. You have pain in the area between your hip bones, and the pain does not go away. You feel pressure on your bladder. You have trouble peeing. You feel full, or your belly hurts, swells, or bloats. You gain or lose weight without trying, or you are less hungry  than normal. You feel pain and pressure in your back. You feel pain and pressure in the area between your hip bones. You think you may be pregnant. Get help right away if: You have pain in your belly that is very bad or gets worse. You have pain in the area between your hip bones, and the pain is very bad or gets worse. You cannot eat or drink without vomiting. You get a fever or chills all of a sudden. Your period is a lot heavier than usual. Summary An ovarian cyst is a fluid-filled sac on an ovary. Some cysts may cause problems and need treatment. Most of these cysts go away on their own. This information is not intended to replace advice given to you by your health care provider. Make sure you discuss any questions you have with your health care provider. Document Revised: 04/06/2020 Document Reviewed: 04/06/2020 Elsevier Patient Education  2023 [employer]  

## 2022-04-19 NOTE — Progress Notes (Signed)
22 y.o GYN presents for FU from ED visit.  C/o intermittent LQ pain 8/10 x 1 week.

## 2022-04-19 NOTE — Progress Notes (Signed)
Ms Ayub presents for ER F/U for ruptured ovarian cyst. See ER note and U/S from 04/15/22. Pt reports pain is better. Taking Motrin for the pain. No sexual active Cycles irregular LMP 03/15/22  PE AF VSS Lungs clear Heart RRR Abd soft,  + BS, non tender GU deferred  A/P Ruptured ovarian cyst  Pt reassured. Continue with Motrin. F/U GYN U/S.

## 2022-05-07 ENCOUNTER — Other Ambulatory Visit: Payer: Self-pay

## 2022-05-07 ENCOUNTER — Emergency Department (HOSPITAL_COMMUNITY)
Admission: EM | Admit: 2022-05-07 | Discharge: 2022-05-07 | Discharge status: Against Medical Advice | Payer: Medicaid Other | Attending: Emergency Medicine | Admitting: Emergency Medicine

## 2022-05-07 ENCOUNTER — Encounter (HOSPITAL_BASED_OUTPATIENT_CLINIC_OR_DEPARTMENT_OTHER): Payer: Self-pay | Admitting: *Deleted

## 2022-05-07 ENCOUNTER — Emergency Department (HOSPITAL_BASED_OUTPATIENT_CLINIC_OR_DEPARTMENT_OTHER): Payer: Medicaid Other

## 2022-05-07 ENCOUNTER — Encounter (HOSPITAL_COMMUNITY): Payer: Self-pay | Admitting: Emergency Medicine

## 2022-05-07 DIAGNOSIS — N760 Acute vaginitis: Secondary | ICD-10-CM | POA: Insufficient documentation

## 2022-05-07 DIAGNOSIS — Z5321 Procedure and treatment not carried out due to patient leaving prior to being seen by health care provider: Secondary | ICD-10-CM | POA: Insufficient documentation

## 2022-05-07 DIAGNOSIS — R1032 Left lower quadrant pain: Secondary | ICD-10-CM | POA: Diagnosis present

## 2022-05-07 DIAGNOSIS — B9689 Other specified bacterial agents as the cause of diseases classified elsewhere: Secondary | ICD-10-CM | POA: Insufficient documentation

## 2022-05-07 LAB — URINALYSIS, ROUTINE W REFLEX MICROSCOPIC
Bilirubin Urine: NEGATIVE
Glucose, UA: NEGATIVE mg/dL
Hgb urine dipstick: NEGATIVE
Ketones, ur: NEGATIVE mg/dL
Leukocytes,Ua: NEGATIVE
Nitrite: NEGATIVE
Protein, ur: 30 mg/dL — AB
Specific Gravity, Urine: 1.041 — ABNORMAL HIGH (ref 1.005–1.030)
pH: 6 (ref 5.0–8.0)

## 2022-05-07 LAB — COMPREHENSIVE METABOLIC PANEL
ALT: 40 U/L (ref 0–44)
AST: 24 U/L (ref 15–41)
Albumin: 4.5 g/dL (ref 3.5–5.0)
Alkaline Phosphatase: 132 U/L — ABNORMAL HIGH (ref 38–126)
Anion gap: 10 (ref 5–15)
BUN: 13 mg/dL (ref 6–20)
CO2: 26 mmol/L (ref 22–32)
Calcium: 10.2 mg/dL (ref 8.9–10.3)
Chloride: 101 mmol/L (ref 98–111)
Creatinine, Ser: 0.81 mg/dL (ref 0.44–1.00)
GFR, Estimated: >60 mL/min (ref >60)
Glucose, Bld: 91 mg/dL (ref 70–99)
Potassium: 3.8 mmol/L (ref 3.5–5.1)
Sodium: 137 mmol/L (ref 135–145)
Total Bilirubin: 0.8 mg/dL (ref 0.3–1.2)
Total Protein: 8.3 g/dL — ABNORMAL HIGH (ref 6.5–8.1)

## 2022-05-07 LAB — CBC
HCT: 38 % (ref 36.0–46.0)
Hemoglobin: 12.7 g/dL (ref 12.0–15.0)
MCH: 29.7 pg (ref 26.0–34.0)
MCHC: 33.4 g/dL (ref 30.0–36.0)
MCV: 89 fL (ref 80.0–100.0)
Platelets: 252 10*3/uL (ref 150–400)
RBC: 4.27 MIL/uL (ref 3.87–5.11)
RDW: 13.5 % (ref 11.5–15.5)
WBC: 6.7 10*3/uL (ref 4.0–10.5)
nRBC: 0 % (ref 0.0–0.2)

## 2022-05-07 LAB — LIPASE, BLOOD: Lipase: 22 U/L (ref 11–51)

## 2022-05-07 LAB — PREGNANCY, URINE: Preg Test, Ur: NEGATIVE

## 2022-05-07 NOTE — ED Triage Notes (Signed)
Pt is here for LLQ abdominal pain which began today.  Pt has hx of ovarian cyst.

## 2022-05-07 NOTE — ED Triage Notes (Signed)
Patient reports LLQ abdominal pain onset today , denies emesis or diarrhea , history of ovarian cyst .

## 2022-05-08 ENCOUNTER — Emergency Department (HOSPITAL_BASED_OUTPATIENT_CLINIC_OR_DEPARTMENT_OTHER)
Admission: EM | Admit: 2022-05-08 | Discharge: 2022-05-08 | Disposition: A | Discharge status: Home / Self Care | Payer: Medicaid Other | Source: Home / Self Care | Attending: Emergency Medicine | Admitting: Emergency Medicine

## 2022-05-08 DIAGNOSIS — R102 Pelvic and perineal pain: Secondary | ICD-10-CM

## 2022-05-08 DIAGNOSIS — B9689 Other specified bacterial agents as the cause of diseases classified elsewhere: Secondary | ICD-10-CM

## 2022-05-08 LAB — WET PREP, GENITAL
Sperm: NONE SEEN
Trich, Wet Prep: NONE SEEN
WBC, Wet Prep HPF POC: <10 (ref <10)
Yeast Wet Prep HPF POC: NONE SEEN

## 2022-05-08 MED ORDER — METRONIDAZOLE 500 MG PO TABS: 500.0000 mg | ORAL_TABLET | Freq: Two times a day (BID) | ORAL | 0 refills | Status: DC

## 2022-05-08 MED ORDER — ALUM & MAG HYDROXIDE-SIMETH 200-200-20 MG/5ML PO SUSP
30.0000 mL | Freq: Once | ORAL | Status: AC
  Administered 2022-05-08: 30 mL via ORAL
  Filled 2022-05-08: qty 30

## 2022-05-08 MED ORDER — METRONIDAZOLE 500 MG PO TABS
500.0000 mg | ORAL_TABLET | Freq: Once | ORAL | Status: AC
  Administered 2022-05-08: 500 mg via ORAL
  Filled 2022-05-08: qty 1

## 2022-05-08 MED ORDER — KETOROLAC TROMETHAMINE 60 MG/2ML IM SOLN
30.0000 mg | Freq: Once | INTRAMUSCULAR | Status: AC
  Administered 2022-05-08: 30 mg via INTRAMUSCULAR
  Filled 2022-05-08: qty 2

## 2022-05-08 NOTE — ED Provider Notes (Addendum)
[place] [facility] EMERGENCY DEPT Provider Note   CSN: [id number] Arrival date & time: 05/07/22  2137     History  Chief Complaint  Patient presents with   Abdominal Pain    Yesenia Clarke is a 21 y.o. female.  The history is provided by the patient.  Abdominal Pain Pain location:  LLQ Pain quality: cramping   Pain radiates to:  Does not radiate Pain severity:  Moderate Onset quality:  Gradual Duration:  1 day Timing:  Constant Progression:  Unchanged Chronicity:  Recurrent Context: not recent travel and not trauma   Relieved by:  Nothing Worsened by:  Nothing Ineffective treatments:  None tried Associated symptoms: vaginal discharge   Associated symptoms: no fever, no vaginal bleeding and no vomiting   Risk factors: not pregnant        Home Medications Prior to Admission medications   Not on File      Allergies    Patient has no known allergies.    Review of Systems   Review of Systems  Constitutional:  Negative for fever.  HENT:  Negative for facial swelling.   Eyes:  Negative for redness.  Respiratory:  Negative for wheezing and stridor.   Gastrointestinal:  Positive for abdominal pain. Negative for vomiting.  Genitourinary:  Positive for pelvic pain and vaginal discharge. Negative for vaginal bleeding.  All other systems reviewed and are negative.   Physical Exam Updated Vital Signs BP 129/72   Pulse 77   Temp 98.1 F (36.7 C) (Oral)   Resp 14   LMP 04/24/2022 (Approximate)   SpO2 100%  Physical Exam Vitals and nursing note reviewed.  Constitutional:      General: She is not in acute distress.    Appearance: Normal appearance. She is well-developed.  HENT:     Head: Normocephalic and atraumatic.     Nose: Nose normal.  Eyes:     Pupils: Pupils are equal, round, and reactive to light.  Cardiovascular:     Rate and Rhythm: Normal rate and regular rhythm.     Pulses: Normal pulses.     Heart sounds: Normal heart sounds.  Pulmonary:      Effort: Pulmonary effort is normal. No respiratory distress.     Breath sounds: Normal breath sounds.  Abdominal:     General: There is no distension.     Palpations: Abdomen is soft.     Tenderness: There is no abdominal tenderness. There is no guarding or rebound.     Comments: Gassy   Genitourinary:    Vagina: No vaginal discharge.  Musculoskeletal:        General: Normal range of motion.     Cervical back: Neck supple.  Skin:    General: Skin is warm and dry.     Capillary Refill: Capillary refill takes less than 2 seconds.     Findings: No erythema or rash.  Neurological:     General: No focal deficit present.     Mental Status: She is alert and oriented to person, place, and time.     Deep Tendon Reflexes: Reflexes normal.  Psychiatric:        Mood and Affect: Mood normal.        Behavior: Behavior normal.     ED Results / Procedures / Treatments   Labs (all labs ordered are listed, but only abnormal results are displayed) Results for orders placed or performed during the hospital encounter of 05/08/22  Lipase, blood  Result Value Ref Range  Lipase 22 11 - 51 U/L  Comprehensive metabolic panel  Result Value Ref Range   Sodium 137 135 - 145 mmol/L   Potassium 3.8 3.5 - 5.1 mmol/L   Chloride 101 98 - 111 mmol/L   CO2 26 22 - 32 mmol/L   Glucose, Bld 91 70 - 99 mg/dL   BUN 13 6 - 20 mg/dL   Creatinine, Ser 2.83 0.44 - 1.00 mg/dL   Calcium 15.1 8.9 - 76.1 mg/dL   Total Protein 8.3 (H) 6.5 - 8.1 g/dL   Albumin 4.5 3.5 - 5.0 g/dL   AST 24 15 - 41 U/L   ALT 40 0 - 44 U/L   Alkaline Phosphatase 132 (H) 38 - 126 U/L   Total Bilirubin 0.8 0.3 - 1.2 mg/dL   GFR, Estimated >60 >73 mL/min   Anion gap 10 5 - 15  CBC  Result Value Ref Range   WBC 6.7 4.0 - 10.5 K/uL   RBC 4.27 3.87 - 5.11 MIL/uL   Hemoglobin 12.7 12.0 - 15.0 g/dL   HCT 71.0 62.6 - 94.8 %   MCV 89.0 80.0 - 100.0 fL   MCH 29.7 26.0 - 34.0 pg   MCHC 33.4 30.0 - 36.0 g/dL   RDW 54.6 27.0 - 35.0 %    Platelets 252 150 - 400 K/uL   nRBC 0.0 0.0 - 0.2 %  Urinalysis, Routine w reflex microscopic  Result Value Ref Range   Color, Urine YELLOW YELLOW   APPearance CLEAR CLEAR   Specific Gravity, Urine 1.041 (H) 1.005 - 1.030   pH 6.0 5.0 - 8.0   Glucose, UA NEGATIVE NEGATIVE mg/dL   Hgb urine dipstick NEGATIVE NEGATIVE   Bilirubin Urine NEGATIVE NEGATIVE   Ketones, ur NEGATIVE NEGATIVE mg/dL   Protein, ur 30 (A) NEGATIVE mg/dL   Nitrite NEGATIVE NEGATIVE   Leukocytes,Ua NEGATIVE NEGATIVE   RBC / HPF 0-5 0 - 5 RBC/hpf   WBC, UA 0-5 0 - 5 WBC/hpf   Squamous Epithelial / LPF 6-10 0 - 5   Mucus PRESENT    Hyaline Casts, UA PRESENT   Pregnancy, urine  Result Value Ref Range   Preg Test, Ur NEGATIVE NEGATIVE   US PELVIC COMPLETE W TRANSVAGINAL AND TORSION R/O  Result Date: 05/08/2022 CLINICAL DATA:  Left lower quadrant pain EXAM: TRANSABDOMINAL AND TRANSVAGINAL ULTRASOUND OF PELVIS DOPPLER ULTRASOUND OF OVARIES TECHNIQUE: Both transabdominal and transvaginal ultrasound examinations of the pelvis were performed. Transabdominal technique was performed for global imaging of the pelvis including uterus, ovaries, adnexal regions, and pelvic cul-de-sac. It was necessary to proceed with endovaginal exam following the transabdominal exam to visualize the uterus, endometrium, ovaries and adnexa. Color and duplex Doppler ultrasound was utilized to evaluate blood flow to the ovaries. COMPARISON:  04/15/2022 FINDINGS: Uterus Measurements: 9.3 x 4.0 x 5.9 cm = volume: 114 mL. No fibroids or other mass visualized. Endometrium Thickness: 11 mm in thickness.  No focal abnormality visualized. Right ovary Measurements: 2.9 x 1.9 x 2.1 cm = volume: 6 mL. Normal appearance/no adnexal mass. Left ovary Measurements: 3.1 x 2.8 x 4.0 cm = volume: 18 mL. 2.2 cm dominant follicle. Again noted is a small hyperechoic area within the left ovary measuring 8 mm. Pulsed Doppler evaluation of both ovaries demonstrates normal  low-resistance arterial and venous waveforms. Other findings Small amount of free fluid in the pelvis. IMPRESSION: No evidence of ovarian torsion. Dominant follicle in the left ovary. 8 mm hyperechoic area within the left ovary is nonspecific.  This could reflect a small dermoid cyst. This is unchanged since prior study. Electronically Signed   By: Rolm Baptise M.D.   On: 05/08/2022 00:32   US PELVIC COMPLETE W TRANSVAGINAL AND TORSION R/O  Result Date: 04/15/2022 CLINICAL DATA:  Pelvic pain, evaluate for ovarian torsion EXAM: TRANSABDOMINAL AND TRANSVAGINAL ULTRASOUND OF PELVIS DOPPLER ULTRASOUND OF OVARIES TECHNIQUE: Both transabdominal and transvaginal ultrasound examinations of the pelvis were performed. Transabdominal technique was performed for global imaging of the pelvis including uterus, ovaries, adnexal regions, and pelvic cul-de-sac. It was necessary to proceed with endovaginal exam following the transabdominal exam to visualize the endometrial stripe and ovaries better. Color and duplex Doppler ultrasound was utilized to evaluate blood flow to the ovaries. COMPARISON:  2002/03/15 FINDINGS: Uterus Measurements: 6.9 x 4.4 x 5.1 cm = volume: 82.25 mL. No fibroids or other mass visualized. Endometrium Thickness: 1.1 cm.  No focal abnormality visualized. Right ovary Measurements: 4.2 x 1.9 x 1.9 cm = volume: 8.06 mL. Normal appearance/no adnexal mass. There is some fluid seen at the right adnexa around the right ovary. Left ovary Measurements: 3.1 x 1.8 x 1.3 cm = volume: 3.6 mL. There is 0.8 x 0.4 x 0.5 cm area of hyperechogenicity seen in the left ovary and is nonspecific. Pulsed Doppler evaluation of both ovaries demonstrates normal low-resistance arterial and venous waveforms. IMPRESSION: There is small to moderate fluid seen around the right ovary, which may be due to ruptured follicle. Small area of hyperechogenicity within the left ovary and is nonspecific may be due to focal area of hemorrhage.  There is no evidence of ovarian torsion. Electronically Signed   By: Frazier Richards M.D.   On: 04/15/2022 09:18     Radiology US PELVIC COMPLETE W TRANSVAGINAL AND TORSION R/O  Result Date: 05/08/2022 CLINICAL DATA:  Left lower quadrant pain EXAM: TRANSABDOMINAL AND TRANSVAGINAL ULTRASOUND OF PELVIS DOPPLER ULTRASOUND OF OVARIES TECHNIQUE: Both transabdominal and transvaginal ultrasound examinations of the pelvis were performed. Transabdominal technique was performed for global imaging of the pelvis including uterus, ovaries, adnexal regions, and pelvic cul-de-sac. It was necessary to proceed with endovaginal exam following the transabdominal exam to visualize the uterus, endometrium, ovaries and adnexa. Color and duplex Doppler ultrasound was utilized to evaluate blood flow to the ovaries. COMPARISON:  04/15/2022 FINDINGS: Uterus Measurements: 9.3 x 4.0 x 5.9 cm = volume: 114 mL. No fibroids or other mass visualized. Endometrium Thickness: 11 mm in thickness.  No focal abnormality visualized. Right ovary Measurements: 2.9 x 1.9 x 2.1 cm = volume: 6 mL. Normal appearance/no adnexal mass. Left ovary Measurements: 3.1 x 2.8 x 4.0 cm = volume: 18 mL. 2.2 cm dominant follicle. Again noted is a small hyperechoic area within the left ovary measuring 8 mm. Pulsed Doppler evaluation of both ovaries demonstrates normal low-resistance arterial and venous waveforms. Other findings Small amount of free fluid in the pelvis. IMPRESSION: No evidence of ovarian torsion. Dominant follicle in the left ovary. 8 mm hyperechoic area within the left ovary is nonspecific. This could reflect a small dermoid cyst. This is unchanged since prior study. Electronically Signed   By: Rolm Baptise M.D.   On: 05/08/2022 00:32    Procedures Procedures    Medications Ordered in ED Medications  metroNIDAZOLE (FLAGYL) tablet 500 mg (has no administration in time range)  alum & mag hydroxide-simeth (MAALOX/MYLANTA) 200-200-20 MG/5ML  suspension 30 mL (has no administration in time range)  ketorolac (TORADOL) injection 30 mg (30 mg Intramuscular Given 05/08/22 0140)  ED Course/ Medical Decision Making/ A&P                           Medical Decision Making Recurrent pelvic pain.  Some vaginal discharge   Amount and/or Complexity of Data Reviewed External Data Reviewed: notes.    Details: previous notes reviewed Labs: ordered.    Details: all labs reviewed:  lipase normal 22, normal sodium and potassium 3.8 normal creatinine .81. Normal white count, normal hemoglobin 12.7 normal platelets 252k.  urine is without UTI.  pregnancy test is negative Radiology: ordered and independent interpretation performed.    Details: normal flow to ovaries by me  Risk OTC drugs. Prescription drug management. Risk Details: Pelvic pain, some of it is gas based on exam so I have given patient a GI cocktail.  I will treat for Bacterial vaginosis and I have advised use of NSAIDs and follow up with GYN.      Final Clinical Impression(s) / ED Diagnoses Final diagnoses:  None  Return for intractable cough, coughing up blood, fevers > 100.4 unrelieved by medication, shortness of breath, intractable vomiting, chest pain, shortness of breath, weakness, numbness, changes in speech, facial asymmetry, abdominal pain, passing out, Inability to tolerate liquids or food, cough, altered mental status or any concerns. No signs of systemic illness or infection. The patient is nontoxic-appearing on exam and vital signs are within normal limits.  I have reviewed the triage vital signs and the nursing notes. Pertinent labs & imaging results that were available during my care of the patient were reviewed by me and considered in my medical decision making (see chart for details). After history, exam, and medical workup I feel the patient has been appropriately medically screened and is safe for discharge home. Pertinent diagnoses were discussed with the  patient. Patient was given return precautions.     Shawonda Kerce, MD 05/08/22 0202

## 2022-05-09 LAB — GC/CHLAMYDIA PROBE AMP (~~LOC~~) NOT AT ARMC
Chlamydia: NEGATIVE
Comment: NEGATIVE
Comment: NORMAL
Neisseria Gonorrhea: NEGATIVE

## 2022-05-16 ENCOUNTER — Telehealth: Payer: Self-pay

## 2022-05-16 NOTE — Telephone Encounter (Signed)
Called patient to discuss need for incontinence pads. Order was received from [employer] requesting incontinence pads.

## 2022-06-17 ENCOUNTER — Ambulatory Visit: Admission: RE | Admit: 2022-06-17 | Payer: Medicaid Other | Source: Ambulatory Visit

## 2022-11-14 ENCOUNTER — Ambulatory Visit: Payer: Medicaid Other | Admitting: Obstetrics and Gynecology

## 2022-12-04 ENCOUNTER — Ambulatory Visit: Payer: Medicaid Other | Admitting: Obstetrics and Gynecology

## 2023-02-05 ENCOUNTER — Ambulatory Visit: Payer: Medicaid Other | Admitting: Obstetrics and Gynecology

## 2023-02-10 ENCOUNTER — Other Ambulatory Visit: Payer: Self-pay | Admitting: *Deleted

## 2023-02-10 ENCOUNTER — Ambulatory Visit (HOSPITAL_BASED_OUTPATIENT_CLINIC_OR_DEPARTMENT_OTHER)
Admission: RE | Admit: 2023-02-10 | Discharge: 2023-02-10 | Disposition: A | Discharge status: Home / Self Care | Payer: Medicaid Other | Source: Ambulatory Visit | Attending: Obstetrics and Gynecology | Admitting: Obstetrics and Gynecology

## 2023-02-10 DIAGNOSIS — R102 Pelvic and perineal pain: Secondary | ICD-10-CM

## 2023-02-10 NOTE — Progress Notes (Signed)
New order for u/s placed today- u/s complete pelvic with transvag.

## 2023-03-11 ENCOUNTER — Other Ambulatory Visit (HOSPITAL_COMMUNITY)
Admission: RE | Admit: 2023-03-11 | Discharge: 2023-03-11 | Disposition: A | Discharge status: Home / Self Care | Payer: Medicaid Other | Source: Ambulatory Visit | Attending: Obstetrics and Gynecology | Admitting: Obstetrics and Gynecology

## 2023-03-11 ENCOUNTER — Ambulatory Visit (INDEPENDENT_AMBULATORY_CARE_PROVIDER_SITE_OTHER): Payer: Medicaid Other

## 2023-03-11 VITALS — BP 133/78 | HR 90 | Wt 191.0 lb

## 2023-03-11 DIAGNOSIS — N898 Other specified noninflammatory disorders of vagina: Secondary | ICD-10-CM | POA: Diagnosis present

## 2023-03-11 NOTE — Progress Notes (Signed)
SUBJECTIVE:  22 y.o. female complains of white, malodorous, and thick vaginal discharge for 1 month. Denies abnormal vaginal bleeding or significant pelvic pain or fever. No UTI symptoms. Denies history of known exposure to STD. Declines STD blood work   Patient's last menstrual period was 03/09/2023.  OBJECTIVE:  She appears well, afebrile. Urine dipstick: not done.  ASSESSMENT:  Vaginal Discharge  Vaginal Odor   PLAN:  GC, chlamydia, trichomonas, BVAG, CVAG probe sent to lab. Treatment: To be determined once lab results are received ROV prn if symptoms persist or worsen.

## 2023-03-12 LAB — CERVICOVAGINAL ANCILLARY ONLY
Bacterial Vaginitis (gardnerella): NEGATIVE
Candida Glabrata: NEGATIVE
Candida Vaginitis: NEGATIVE
Chlamydia: NEGATIVE
Comment: NEGATIVE
Comment: NEGATIVE
Comment: NEGATIVE
Comment: NEGATIVE
Comment: NEGATIVE
Comment: NORMAL
Neisseria Gonorrhea: NEGATIVE
Trichomonas: NEGATIVE

## 2023-06-11 ENCOUNTER — Ambulatory Visit (HOSPITAL_COMMUNITY)
Admission: EM | Admit: 2023-06-11 | Discharge: 2023-06-11 | Disposition: A | Discharge status: Home / Self Care | Payer: BLUE CROSS/BLUE SHIELD | Attending: Family Medicine | Admitting: Family Medicine

## 2023-06-11 ENCOUNTER — Encounter (HOSPITAL_COMMUNITY): Payer: Self-pay

## 2023-06-11 DIAGNOSIS — N898 Other specified noninflammatory disorders of vagina: Secondary | ICD-10-CM | POA: Diagnosis present

## 2023-06-11 DIAGNOSIS — Z113 Encounter for screening for infections with a predominantly sexual mode of transmission: Secondary | ICD-10-CM | POA: Diagnosis not present

## 2023-06-11 LAB — HIV ANTIBODY (ROUTINE TESTING W REFLEX): HIV Screen 4th Generation wRfx: NONREACTIVE

## 2023-06-11 MED ORDER — FLUCONAZOLE 150 MG PO TABS: ORAL_TABLET | ORAL | 0 refills | Status: DC

## 2023-06-11 NOTE — ED Notes (Signed)
Dee collected labs.

## 2023-06-11 NOTE — ED Triage Notes (Signed)
Here for expose to STI testing. Pt stated she has vaginal bumps.

## 2023-06-11 NOTE — Discharge Instructions (Signed)
We have sent testing for sexually transmitted infections. We will notify you of any positive results once they are received. If required, we will prescribe any medications you might need.  Please refrain from all sexual activity for at least the next seven days.

## 2023-06-12 NOTE — ED Provider Notes (Signed)
[facility]   [id number] 06/11/23 Arrival Time: 1849  ASSESSMENT & PLAN:  1. Vaginal discharge   2. Vaginal irritation    Will treat empirically for yeast infection.  Meds ordered this encounter  Medications   fluconazole (DIFLUCAN) 150 MG tablet    Sig: Take one tablet by mouth as a single dose. May repeat in 3 days if symptoms persist.    Dispense:  2 tablet    Refill:  0       Discharge Instructions      We have sent testing for sexually transmitted infections. We will notify you of any positive results once they are received. If required, we will prescribe any medications you might need.  Please refrain from all sexual activity for at least the next seven days.     Without s/s of PID.  Labs Reviewed  RPR  HIV ANTIBODY (ROUTINE TESTING W REFLEX)  CERVICOVAGINAL ANCILLARY ONLY    Will notify of any positive results. Instructed to refrain from sexual activity for at least seven days.  Reviewed expectations re: course of current medical issues. Questions answered. Outlined signs and symptoms indicating need for more acute intervention. Patient verbalized understanding. After Visit Summary given.   SUBJECTIVE:  Yesenia Clarke is a 22 y.o. female who presents with complaint of vaginal discharge/irritation. Here for expose to STI testing. Pt stated she has vaginal bumps.  No associated pain.  Patient's last menstrual period was 04/22/2023 (approximate).  OBJECTIVE:  Vitals:   06/11/23 1937  BP: 135/81  Pulse: 82  Resp: 16  Temp: 99.6 F (37.6 C)  TempSrc: Oral  SpO2: 97%     General appearance: alert, cooperative, appears stated age and no distress Lungs: unlabored respirations; speaks full sentences without difficulty Back: no CVA tenderness; FROM at waist Abdomen: soft, non-tender GU: (RN Hutchins as chaperone) no vaginal lesions Skin: warm and dry Psychological: alert and cooperative; normal mood and affect.  Results for orders placed  or performed during the hospital encounter of 06/11/23  RPR  Result Value Ref Range   RPR Ser Ql NON REACTIVE NON REACTIVE  HIV Antibody (routine testing w rflx)  Result Value Ref Range   HIV Screen 4th Generation wRfx Non Reactive Non Reactive    Labs Reviewed  RPR  HIV ANTIBODY (ROUTINE TESTING W REFLEX)  CERVICOVAGINAL ANCILLARY ONLY    No Known Allergies  Past Medical History:  Diagnosis Date   ADHD (attention deficit hyperactivity disorder)    Anxiety    Depression    Pregnancy induced hypertension    Shoulder dystocia, delivered 12/20/2021   Family History  Problem Relation Age of Onset   Drug abuse Father    Alcoholism Father    Hypertension Maternal Grandmother    Social History   Socioeconomic History   Marital status: Single    Spouse name: Not on file   Number of children: 0   Years of education: Not on file   Highest education level: Not on file  Occupational History   Occupation: full time  Tobacco Use   Smoking status: Never    Passive exposure: Never   Smokeless tobacco: Never  Vaping Use   Vaping status: Never Used  Substance and Sexual Activity   Alcohol use: No   Drug use: No   Sexual activity: Not Currently  Other Topics Concern   Not on file  Social History Narrative   Not on file   Social Determinants of Health   Financial Resource Strain:  Not on file  Food Insecurity: Not on file  Transportation Needs: Not on file  Physical Activity: Not on file  Stress: Not on file  Social Connections: Not on file  Intimate Partner Violence: Not on file           Mardella Layman, MD 06/12/23 1018

## 2023-06-15 ENCOUNTER — Other Ambulatory Visit: Payer: Self-pay

## 2023-06-15 ENCOUNTER — Encounter (HOSPITAL_BASED_OUTPATIENT_CLINIC_OR_DEPARTMENT_OTHER): Payer: Self-pay

## 2023-06-15 ENCOUNTER — Emergency Department (HOSPITAL_BASED_OUTPATIENT_CLINIC_OR_DEPARTMENT_OTHER)
Admission: EM | Admit: 2023-06-15 | Discharge: 2023-06-16 | Disposition: A | Discharge status: Home / Self Care | Payer: BLUE CROSS/BLUE SHIELD | Attending: Emergency Medicine | Admitting: Emergency Medicine

## 2023-06-15 DIAGNOSIS — R519 Headache, unspecified: Secondary | ICD-10-CM | POA: Diagnosis present

## 2023-06-15 DIAGNOSIS — R5383 Other fatigue: Secondary | ICD-10-CM | POA: Insufficient documentation

## 2023-06-15 DIAGNOSIS — R197 Diarrhea, unspecified: Secondary | ICD-10-CM | POA: Insufficient documentation

## 2023-06-15 LAB — CBC WITH DIFFERENTIAL/PLATELET
Abs Immature Granulocytes: 0.01 10*3/uL (ref 0.00–0.07)
Basophils Absolute: 0 10*3/uL (ref 0.0–0.1)
Basophils Relative: 1 %
Eosinophils Absolute: 0.1 10*3/uL (ref 0.0–0.5)
Eosinophils Relative: 1 %
HCT: 36.7 % (ref 36.0–46.0)
Hemoglobin: 12.4 g/dL (ref 12.0–15.0)
Immature Granulocytes: 0 %
Lymphocytes Relative: 29 %
Lymphs Abs: 1.6 10*3/uL (ref 0.7–4.0)
MCH: 30.3 pg (ref 26.0–34.0)
MCHC: 33.8 g/dL (ref 30.0–36.0)
MCV: 89.7 fL (ref 80.0–100.0)
Monocytes Absolute: 0.7 10*3/uL (ref 0.1–1.0)
Monocytes Relative: 12 %
Neutro Abs: 3.1 10*3/uL (ref 1.7–7.7)
Neutrophils Relative %: 57 %
Platelets: 187 10*3/uL (ref 150–400)
RBC: 4.09 MIL/uL (ref 3.87–5.11)
RDW: 13.2 % (ref 11.5–15.5)
WBC: 5.4 10*3/uL (ref 4.0–10.5)
nRBC: 0 % (ref 0.0–0.2)

## 2023-06-15 LAB — URINALYSIS, ROUTINE W REFLEX MICROSCOPIC
Bilirubin Urine: NEGATIVE
Glucose, UA: NEGATIVE mg/dL
Hgb urine dipstick: NEGATIVE
Ketones, ur: NEGATIVE mg/dL
Leukocytes,Ua: NEGATIVE
Nitrite: NEGATIVE
Protein, ur: NEGATIVE mg/dL
Specific Gravity, Urine: 1.02 (ref 1.005–1.030)
pH: 6 (ref 5.0–8.0)

## 2023-06-15 MED ORDER — METOCLOPRAMIDE HCL 5 MG/ML IJ SOLN
10.0000 mg | Freq: Once | INTRAMUSCULAR | Status: AC
  Administered 2023-06-15: 10 mg via INTRAVENOUS
  Filled 2023-06-15: qty 2

## 2023-06-15 MED ORDER — DIPHENHYDRAMINE HCL 50 MG/ML IJ SOLN
25.0000 mg | Freq: Once | INTRAMUSCULAR | Status: AC
  Administered 2023-06-15: 25 mg via INTRAVENOUS
  Filled 2023-06-15: qty 1

## 2023-06-15 MED ORDER — SODIUM CHLORIDE 0.9 % IV BOLUS
1000.0000 mL | Freq: Once | INTRAVENOUS | Status: AC
  Administered 2023-06-15: 1000 mL via INTRAVENOUS

## 2023-06-15 MED ORDER — PROCHLORPERAZINE EDISYLATE 10 MG/2ML IJ SOLN: 10.0000 mg | Freq: Once | INTRAMUSCULAR | Status: DC

## 2023-06-15 NOTE — ED Triage Notes (Signed)
POV from home, A&o x 4, GCS 15, amb to room  C/o lower abd pain that started one week ago intermittently, nausea/headache and fatigue today. Hx of ovarian cysts.

## 2023-06-15 NOTE — ED Provider Notes (Signed)
[place] EMERGENCY DEPARTMENT AT [facility] Provider Note   CSN: [id number] Arrival date & time: 06/15/23  2247     History {Add pertinent medical, surgical, social history, OB history to HPI:1} Chief Complaint  Patient presents with   Abdominal Pain    Yesenia Clarke is a 22 y.o. female.  The history is provided by the patient.  Abdominal Pain She actually has 3 separate complaints.  Her primary complaint is a frontal headache which started this morning.  Pain is throbbing and is associated with nausea but no vomiting.  Bright light makes the headache worse.  She has taken ibuprofen without relief.  Second complaint is feeling generally fatigued with diarrhea.  That has been going on for about a week.  She has been having diarrhea 2-3 times a day.  She denies fever or chills but has been feeling lightheaded.  Third complaint is intermittent pelvic pain.  Pelvic pain can last anywhere from 10 minutes to 2 hours but has been an intermittent problem for years.  Current symptoms are no worse than they had been previously.  Pain is in the midline.  She had been seen at urgent care for vaginal irritation 4 days ago and was found to have a yeast infection which has been treated and those symptoms are improved.   Home Medications Prior to Admission medications   Medication Sig Start Date End Date Taking? Authorizing Provider  fluconazole (DIFLUCAN) 150 MG tablet Take one tablet by mouth as a single dose. May repeat in 3 days if symptoms persist. 06/11/23   Mardella Layman, MD      Allergies    Patient has no known allergies.    Review of Systems   Review of Systems  Gastrointestinal:  Positive for abdominal pain.  All other systems reviewed and are negative.   Physical Exam Updated Vital Signs BP 134/79   Pulse 87   Temp 98.3 F (36.8 C) (Oral)   Resp 17   Ht 5\' 6"  (1.676 m)   Wt 81.6 kg   LMP 05/28/2023 (Approximate)   SpO2 97%   Breastfeeding No   BMI 29.05 kg/m   Physical Exam Vitals and nursing note reviewed.   22 year old female, resting comfortably and in no acute distress. Vital signs are normal. Oxygen saturation is 97%, which is normal. Head is normocephalic and atraumatic. PERRLA, EOMI. Oropharynx is clear.  Photophobia is present. There is no tenderness palpation over the temporalis muscles or over the insertion of the paracervical muscles. Neck is nontender and supple.   Back is nontender and there is no CVA tenderness. Lungs are clear without rales, wheezes, or rhonchi. Chest is nontender. Heart has regular rate and rhythm without murmur. Abdomen is soft, flat, nontender. Pelvic: ***. Extremities have no cyanosis or edema, full range of motion is present. Skin is warm and dry without rash. Neurologic: Mental status is normal, cranial nerves are intact, moves all extremities equally.  ED Results / Procedures / Treatments   Labs (all labs ordered are listed, but only abnormal results are displayed) Labs Reviewed - No data to display  EKG None  Radiology No results found.  Procedures Procedures  {Document cardiac monitor, telemetry assessment procedure when appropriate:1}  Medications Ordered in ED Medications - No data to display  ED Course/ Medical Decision Making/ A&P   {   Click here for ABCD2, HEART and other calculatorsREFRESH Note before signing :1}  Medical Decision Making  Headache which seems that it might be a migraine variant versus muscle contraction headache.  No red flags to suggest serious pathology such as meningitis or subarachnoid hemorrhage.  I have ordered headache cocktail of IV fluids, prochlorperazine, diphenhydramine.  Fatigue and diarrhea which seem most likely to be a viral illness.  I have ordered laboratory workup of CBC, comprehensive metabolic panel, lipase and I have also ordered urinalysis and pregnancy test.  Pelvic pain which has been longstanding.  Patient does  state that she was told that she had an ovarian cyst at one point.  I have reviewed her past records and note urgent care visit for monilial vaginitis on 06/11/2023 treated with fluconazole.  Test for gonorrhea and chlamydia as well as HIV and RPR were all negative.  She has had pelvic ultrasounds done on 04/15/2022, 05/07/2022, 02/09/2022 all of which show a 7 mm echogenic focus in the left ovary which is felt to likely represent a small dermoid cyst.  {Document critical care time when appropriate:1} {Document review of labs and clinical decision tools ie heart score, Chads2Vasc2 etc:1}  {Document your independent review of radiology images, and any outside records:1} {Document your discussion with family members, caretakers, and with consultants:1} {Document social determinants of health affecting pt's care:1} {Document your decision making why or why not admission, treatments were needed:1} Final Clinical Impression(s) / ED Diagnoses Final diagnoses:  None    Rx / DC Orders ED Discharge Orders     None

## 2023-06-16 DIAGNOSIS — R519 Headache, unspecified: Secondary | ICD-10-CM | POA: Diagnosis not present

## 2023-06-16 MED ORDER — DEXAMETHASONE SODIUM PHOSPHATE 10 MG/ML IJ SOLN
10.0000 mg | Freq: Once | INTRAMUSCULAR | Status: AC
  Administered 2023-06-16: 10 mg via INTRAVENOUS
  Filled 2023-06-16: qty 1

## 2023-06-16 MED ORDER — KETOROLAC TROMETHAMINE 30 MG/ML IJ SOLN
30.0000 mg | Freq: Once | INTRAMUSCULAR | Status: AC
  Administered 2023-06-16: 30 mg via INTRAVENOUS
  Filled 2023-06-16: qty 1

## 2023-06-16 NOTE — Discharge Instructions (Signed)
Drink plenty of fluids.  You may take loperamide (Imodium A-D) as needed for diarrhea.  You may take ibuprofen or naproxen as needed for pain.  If you need additional pain relief, you may add acetaminophen.  Return if you have any new or concerning symptoms.

## 2023-06-18 ENCOUNTER — Ambulatory Visit: Payer: Medicaid Other

## 2023-06-19 ENCOUNTER — Ambulatory Visit: Payer: Medicaid Other

## 2023-07-04 ENCOUNTER — Encounter: Payer: Self-pay | Admitting: Obstetrics and Gynecology

## 2023-07-16 ENCOUNTER — Emergency Department (HOSPITAL_BASED_OUTPATIENT_CLINIC_OR_DEPARTMENT_OTHER)
Admission: EM | Admit: 2023-07-16 | Discharge: 2023-07-16 | Disposition: A | Discharge status: Home / Self Care | Payer: BC Managed Care – PPO | Attending: Emergency Medicine | Admitting: Emergency Medicine

## 2023-07-16 ENCOUNTER — Other Ambulatory Visit: Payer: Self-pay

## 2023-07-16 ENCOUNTER — Encounter (HOSPITAL_BASED_OUTPATIENT_CLINIC_OR_DEPARTMENT_OTHER): Payer: Self-pay | Admitting: Emergency Medicine

## 2023-07-16 DIAGNOSIS — N898 Other specified noninflammatory disorders of vagina: Secondary | ICD-10-CM | POA: Diagnosis present

## 2023-07-16 DIAGNOSIS — N760 Acute vaginitis: Secondary | ICD-10-CM | POA: Insufficient documentation

## 2023-07-16 DIAGNOSIS — B9689 Other specified bacterial agents as the cause of diseases classified elsewhere: Secondary | ICD-10-CM | POA: Insufficient documentation

## 2023-07-16 LAB — URINALYSIS, W/ REFLEX TO CULTURE (INFECTION SUSPECTED)
Bacteria, UA: NONE SEEN
Bilirubin Urine: NEGATIVE
Glucose, UA: NEGATIVE mg/dL
Nitrite: NEGATIVE
Protein, ur: NEGATIVE mg/dL
Specific Gravity, Urine: 1.024 (ref 1.005–1.030)
pH: 6 (ref 5.0–8.0)

## 2023-07-16 LAB — HIV ANTIBODY (ROUTINE TESTING W REFLEX): HIV Screen 4th Generation wRfx: NONREACTIVE

## 2023-07-16 LAB — PREGNANCY, URINE: Preg Test, Ur: NEGATIVE

## 2023-07-16 LAB — WET PREP, GENITAL
Sperm: NONE SEEN
Trich, Wet Prep: NONE SEEN
WBC, Wet Prep HPF POC: >=10 — AB (ref <10)
Yeast Wet Prep HPF POC: NONE SEEN

## 2023-07-16 LAB — RPR: RPR Ser Ql: NONREACTIVE

## 2023-07-16 MED ORDER — METRONIDAZOLE 500 MG PO TABS: 500.0000 mg | ORAL_TABLET | Freq: Two times a day (BID) | ORAL | 0 refills | Status: DC

## 2023-07-16 NOTE — ED Provider Notes (Signed)
[place] EMERGENCY DEPARTMENT AT [facility] Provider Note   CSN: [id number] Arrival date & time: 07/16/23  0211     History  Chief complaint vaginal irritation  Yesenia Clarke is a 22 y.o. female.  The history is provided by the patient.  She comes in complaining of vaginal irritation for the last 2 weeks which is getting worse.  She has not noted any discharge but she is currently on her menses which started 3 days ago.  Menses came on at the expected time and has had normal bleeding and cramping.  She did have an appointment with her gynecologist on 07/17/2023, but the irritation got so bad that she did not feel she could wait.  There has been some associated itching.  She also has noted some dysuria along with urinary urgency and frequency.  She is concerned about getting tested for sexually transmitted infections, admits that she has had unprotected sex.   Home Medications Prior to Admission medications   Not on File      Allergies    Patient has no known allergies.    Review of Systems   Review of Systems  All other systems reviewed and are negative.   Physical Exam Updated Vital Signs BP 125/72 (BP Location: Right Arm)   Pulse 72   Temp 98.9 F (37.2 C) (Oral)   Resp 18   Wt 81 kg   LMP 07/16/2023 (Approximate)   SpO2 100%   BMI 28.82 kg/m  Physical Exam Vitals and nursing note reviewed.   22 year old female, resting comfortably and in no acute distress. Vital signs are normal. Oxygen saturation is 100%, which is normal. Head is normocephalic and atraumatic. PERRLA, EOMI.  Back is nontender and there is no CVA tenderness. Lungs are clear without rales, wheezes, or rhonchi. Chest is nontender. Heart has regular rate and rhythm without murmur. Abdomen is soft, flat, nontender. Pelvic: Female genitalia with no rash, cervix is closed with slight brownish discharge but no bleeding.  Fundus is retroverted, no adnexal masses or tenderness, no cervical motion  tenderness. Neurologic: Awake and alert, moves all extremities equally.  ED Results / Procedures / Treatments   Labs (all labs ordered are listed, but only abnormal results are displayed) Labs Reviewed  WET PREP, GENITAL - Abnormal; Notable for the following components:      Result Value   Clue Cells Wet Prep HPF POC PRESENT (*)    WBC, Wet Prep HPF POC >=10 (*)    All other components within normal limits  URINALYSIS, W/ REFLEX TO CULTURE (INFECTION SUSPECTED) - Abnormal; Notable for the following components:   Hgb urine dipstick TRACE (*)    Ketones, ur TRACE (*)    Leukocytes,Ua SMALL (*)    All other components within normal limits  PREGNANCY, URINE  RPR  HIV ANTIBODY (ROUTINE TESTING W REFLEX)  GC/CHLAMYDIA PROBE AMP ([place]) NOT AT [facility]    Procedures Procedures    Medications Ordered in ED Medications - No data to display  ED Course/ Medical Decision Making/ A&P                                 Medical Decision Making Amount and/or Complexity of Data Reviewed Labs: ordered.  Risk Prescription drug management.   Vaginal irritation and itching, consider UTI, monilial vaginitis, trichomonas vaginitis, bacterial vaginosis.  I have ordered STI panel, urinalysis.  I have interpreted her laboratory  test, and my interpretation is negative pregnancy test, normal urinalysis, wet prep significant for clue cells which may be responsible for her symptoms.  I am discharging her with prescription for metronidazole, I have advised her to check MyChart for results of gonorrhea/chlamydia, RPR, HIV tests.  I have encouraged her to use safe sex practices.  Final Clinical Impression(s) / ED Diagnoses Final diagnoses:  Bacterial vaginosis    Rx / DC Orders ED Discharge Orders          Ordered    metroNIDAZOLE (FLAGYL) 500 MG tablet  2 times daily        07/16/23 0457              Dione Booze, MD 07/16/23 [phone]

## 2023-07-16 NOTE — ED Triage Notes (Signed)
Vaginal itching and pain x 2 weeks. Currently menstruating but prior to this notices slight odor and thicker white discharge.  Denies urinary sx, fever, abd pain.

## 2023-07-17 ENCOUNTER — Ambulatory Visit: Payer: BLUE CROSS/BLUE SHIELD

## 2023-07-17 LAB — GC/CHLAMYDIA PROBE AMP (~~LOC~~) NOT AT ARMC
Chlamydia: POSITIVE — AB
Comment: NEGATIVE
Comment: NORMAL
Neisseria Gonorrhea: POSITIVE — AB

## 2023-08-04 ENCOUNTER — Ambulatory Visit: Payer: BC Managed Care – PPO | Admitting: Advanced Practice Midwife

## 2023-08-05 ENCOUNTER — Other Ambulatory Visit (HOSPITAL_COMMUNITY)
Admission: RE | Admit: 2023-08-05 | Discharge: 2023-08-05 | Disposition: A | Discharge status: Home / Self Care | Payer: BC Managed Care – PPO | Source: Ambulatory Visit | Attending: Advanced Practice Midwife | Admitting: Advanced Practice Midwife

## 2023-08-05 ENCOUNTER — Ambulatory Visit (INDEPENDENT_AMBULATORY_CARE_PROVIDER_SITE_OTHER): Payer: BC Managed Care – PPO | Admitting: Advanced Practice Midwife

## 2023-08-05 ENCOUNTER — Encounter: Payer: Self-pay | Admitting: Advanced Practice Midwife

## 2023-08-05 VITALS — BP 118/63 | HR 94 | Ht 66.0 in | Wt 186.0 lb

## 2023-08-05 DIAGNOSIS — Z124 Encounter for screening for malignant neoplasm of cervix: Secondary | ICD-10-CM

## 2023-08-05 DIAGNOSIS — N898 Other specified noninflammatory disorders of vagina: Secondary | ICD-10-CM

## 2023-08-05 DIAGNOSIS — A549 Gonococcal infection, unspecified: Secondary | ICD-10-CM | POA: Insufficient documentation

## 2023-08-05 DIAGNOSIS — Z113 Encounter for screening for infections with a predominantly sexual mode of transmission: Secondary | ICD-10-CM | POA: Diagnosis present

## 2023-08-05 DIAGNOSIS — A749 Chlamydial infection, unspecified: Secondary | ICD-10-CM | POA: Insufficient documentation

## 2023-08-05 DIAGNOSIS — Z01419 Encounter for gynecological examination (general) (routine) without abnormal findings: Secondary | ICD-10-CM

## 2023-08-05 DIAGNOSIS — B3731 Acute candidiasis of vulva and vagina: Secondary | ICD-10-CM

## 2023-08-05 MED ORDER — FLUCONAZOLE 150 MG PO TABS
ORAL_TABLET | ORAL | 0 refills | Status: DC
Start: 2023-08-05 — End: 2023-09-27

## 2023-08-05 NOTE — Progress Notes (Signed)
Subjective:     Yesenia Clarke is a 22 y.o. female here at [facility] for an annual exam.  Current complaints: vaginal irritation started 2-3 days ago. Pt was treated for gonorrhea/chlamydia 3 weeks ago.  Otherwise, there are no gyn concerns. Periods are regular and uncomplicated.  Personal health history reviewed: yes.  Do you have a primary care provider? yes Do you feel safe at home? yes  Flowsheet Row Routine Prenatal from 11/08/2021 in [facility] for [facility] Total Score 0       Health Maintenance Due  Topic Date Due   HPV VACCINES (1 - 3-dose series) Never done   Hepatitis C Screening  Never done   Cervical Cancer Screening (Pap smear)  Never done   INFLUENZA VACCINE  06/12/2023   COVID-19 Vaccine (1 - 2023-24 season) Never done     Risk factors for chronic health problems: Smoking: never Alchohol/how much: none Pt BMI: Body mass index is 30.02 kg/m.   Gynecologic History Patient's last menstrual period was 07/16/2023 (approximate). Contraception: condoms Last Pap: n/a.  Last mammogram: n/a.   Obstetric History OB History  Gravida Para Term Preterm AB Living  1 1 1     1   SAB IAB Ectopic Multiple Live Births          1    # Outcome Date GA Lbr Len/2nd Weight Sex Type Anes PTL Lv  1 Term 11/14/21 [redacted]w[redacted]d  6 lb 4.8 oz (2.858 kg) F Vag-Spont EPI  LIV     The following portions of the patient's history were reviewed and updated as appropriate: allergies, current medications, past family history, past medical history, past social history, past surgical history, and problem list.  Review of Systems Pertinent items noted in HPI and remainder of comprehensive ROS otherwise negative.    Objective:  BP 118/63   Pulse 94   Ht 5\' 6"  (1.676 m)   Wt 186 lb (84.4 kg)   LMP 07/16/2023 (Approximate)   Breastfeeding No   BMI 30.02 kg/m   VS reviewed, nursing note reviewed,  Constitutional: well developed, well nourished, no  distress HEENT: normocephalic, thyroid without enlargement or mass HEART: RRR, no murmurs rubs/gallops RESP: clear and equal to auscultation bilaterally in all lobes  Breast Exam:  Deferred with low risks and shared decision making, discussed recommendation to start mammogram between 40-50 yo/ Abdomen: soft Neuro: alert and oriented x 3 Skin: warm, dry Psych: affect normal Pelvic exam: Performed: Cervix pink, visually closed, without lesion, scant white creamy discharge, vaginal walls and external genitalia normal Bimanual exam: Cervix 0/long/high, firm, anterior, neg CMT, uterus nontender, nonenlarged, adnexa without tenderness, enlargement, or mass        Assessment/Plan:   1. Encounter for annual routine gynecological examination   2. Screening examination for STI  - Cervicovaginal ancillary only - Hepatitis B surface antigen - Hepatitis C antibody - HIV Antibody (routine testing w rflx) - RPR  3. Vaginal irritation --Mild erythema of vaginal walls at introitus, one tiny area of broken skin, no other lesions, no pain to palpation --Yeast vaginitis most likely following abx course --Will do TOC for chlamydia/gonorrhea and treat for yeast with Diflucan --HSV culture obtained  - fluconazole (DIFLUCAN) 150 MG tablet; Take one tablet now, and one in 3 days  Dispense: 2 tablet; Refill: 0 - Cervicovaginal ancillary only - Herpes simplex virus culture  4. Screening for cervical cancer  - Cytology - PAP( [place])  5. Vaginal candidiasis  - fluconazole (DIFLUCAN) 150 MG tablet; Take one tablet now, and one in 3 days  Dispense: 2 tablet; Refill: 0  6. Gonorrhea --Tx on 07/16/23  7. Chlamydia --Tx on 07/16/23   Return for annual exam.   Sharen Counter, CNM 5:29 PM

## 2023-08-06 LAB — CERVICOVAGINAL ANCILLARY ONLY
Bacterial Vaginitis (gardnerella): NEGATIVE
Candida Glabrata: NEGATIVE
Candida Vaginitis: NEGATIVE
Chlamydia: NEGATIVE
Comment: NEGATIVE
Comment: NEGATIVE
Comment: NEGATIVE
Comment: NEGATIVE
Comment: NEGATIVE
Comment: NORMAL
Neisseria Gonorrhea: NEGATIVE
Trichomonas: NEGATIVE

## 2023-08-06 LAB — HEPATITIS C ANTIBODY: Hep C Virus Ab: NONREACTIVE

## 2023-08-06 LAB — HIV ANTIBODY (ROUTINE TESTING W REFLEX): HIV Screen 4th Generation wRfx: NONREACTIVE

## 2023-08-06 LAB — HEPATITIS B SURFACE ANTIGEN: Hepatitis B Surface Ag: NEGATIVE

## 2023-08-06 LAB — RPR: RPR Ser Ql: NONREACTIVE

## 2023-08-08 LAB — HERPES SIMPLEX VIRUS CULTURE

## 2023-08-13 LAB — CYTOLOGY - PAP: Diagnosis: NEGATIVE

## 2023-09-11 ENCOUNTER — Other Ambulatory Visit (HOSPITAL_BASED_OUTPATIENT_CLINIC_OR_DEPARTMENT_OTHER): Payer: Self-pay

## 2023-09-27 ENCOUNTER — Encounter (HOSPITAL_BASED_OUTPATIENT_CLINIC_OR_DEPARTMENT_OTHER): Payer: Self-pay | Admitting: Emergency Medicine

## 2023-09-27 ENCOUNTER — Other Ambulatory Visit: Payer: Self-pay

## 2023-09-27 ENCOUNTER — Emergency Department (HOSPITAL_BASED_OUTPATIENT_CLINIC_OR_DEPARTMENT_OTHER)
Admission: EM | Admit: 2023-09-27 | Discharge: 2023-09-27 | Disposition: A | Discharge status: Home / Self Care | Payer: BC Managed Care – PPO | Attending: Emergency Medicine | Admitting: Emergency Medicine

## 2023-09-27 DIAGNOSIS — N760 Acute vaginitis: Secondary | ICD-10-CM | POA: Insufficient documentation

## 2023-09-27 DIAGNOSIS — N898 Other specified noninflammatory disorders of vagina: Secondary | ICD-10-CM | POA: Diagnosis present

## 2023-09-27 LAB — URINALYSIS, ROUTINE W REFLEX MICROSCOPIC
Glucose, UA: NEGATIVE mg/dL
Ketones, ur: NEGATIVE mg/dL
Leukocytes,Ua: NEGATIVE
Nitrite: NEGATIVE
Protein, ur: NEGATIVE mg/dL
Specific Gravity, Urine: >=1.03 (ref 1.005–1.030)
pH: 5.5 (ref 5.0–8.0)

## 2023-09-27 LAB — URINALYSIS, MICROSCOPIC (REFLEX)

## 2023-09-27 LAB — WET PREP, GENITAL
Clue Cells Wet Prep HPF POC: NONE SEEN
Sperm: NONE SEEN
Trich, Wet Prep: NONE SEEN
WBC, Wet Prep HPF POC: <10 (ref <10)
Yeast Wet Prep HPF POC: NONE SEEN

## 2023-09-27 LAB — PREGNANCY, URINE: Preg Test, Ur: NEGATIVE

## 2023-09-27 NOTE — ED Provider Notes (Signed)
  [place] EMERGENCY DEPARTMENT AT [facility] Provider Note   CSN: [id number] Arrival date & time: 09/27/23  0258     History  Chief Complaint  Patient presents with   Groin Swelling    Yesenia Clarke is a 22 y.o. female.  The history is provided by the patient and a significant other.  Patient presents for pelvic complaints.  She reports for the past week she has had vaginal itching and burning and swelling.  She is currently on her menstrual cycle.  She came in tonight because she was having intercourse and it hurt.  No fevers or vomiting.  She had some abdominal cramping that is improving     Home Medications Prior to Admission medications   Not on File      Allergies    Patient has no known allergies.    Review of Systems   Review of Systems  Physical Exam Updated Vital Signs BP 123/63   Pulse 72   Temp 98.1 F (36.7 C) (Oral)   Resp 18   Wt 84.4 kg   LMP 09/24/2023 (Exact Date)   SpO2 100%   BMI 30.02 kg/m  Physical Exam CONSTITUTIONAL: Well developed/well nourished HEAD: Normocephalic/atraumatic ABDOMEN: soft, nontender, no rebound or guarding, bowel sounds noted throughout abdomen GU: No vaginal discharge or excessive bleeding is noted.  No Bartholin cyst.  No rash or abscess noted No CMT.  Female chaperone present for exam NEURO: Pt is awake/alert/appropriate, moves all extremitiesx4.  No facial droop.   SKIN: warm, color normal PSYCH: no abnormalities of mood noted, alert and oriented to situation  ED Results / Procedures / Treatments   Labs (all labs ordered are listed, but only abnormal results are displayed) Labs Reviewed  URINALYSIS, ROUTINE W REFLEX MICROSCOPIC - Abnormal; Notable for the following components:      Result Value   APPearance HAZY (*)    Hgb urine dipstick SMALL (*)    Bilirubin Urine SMALL (*)    All other components within normal limits  URINALYSIS, MICROSCOPIC (REFLEX) - Abnormal; Notable for the following  components:   Bacteria, UA RARE (*)    All other components within normal limits  WET PREP, GENITAL  PREGNANCY, URINE  GC/CHLAMYDIA PROBE AMP ([place]) NOT AT [facility]    EKG None  Radiology No results found.  Procedures Procedures    Medications Ordered in ED Medications - No data to display  ED Course/ Medical Decision Making/ A&P Clinical Course as of 09/27/23 0358  Sat Sep 27, 2023  4098 Patient presents with pelvic pain and concern for pelvic infection.  Pelvic exam was chaperoned by female nurse tech.  Patient insisted on boyfriend being present during the entire exam  Overall exam was unremarkable.  Advised follow-up with gynecology.  Advised avoid all sexual intercourse until all labs are resulted and symptoms are resolved [DW]    Clinical Course User Index [DW] Zadie Rhine, MD                                 Medical Decision Making Amount and/or Complexity of Data Reviewed Labs: ordered.           Final Clinical Impression(s) / ED Diagnoses Final diagnoses:  Acute vaginitis    Rx / DC Orders ED Discharge Orders     None         Zadie Rhine, MD 09/27/23 [phone]

## 2023-09-27 NOTE — ED Notes (Signed)
Pelvic cart at bedside. 

## 2023-09-27 NOTE — ED Triage Notes (Signed)
Patient endorses vaginal itching, burning, swelling and pain, worse after intercourse x 1 week.  Patient also endorses a "rash."

## 2023-09-27 NOTE — Discharge Instructions (Signed)
Please follow-up with your gynecologist next week.  Avoid all sexual intercourse until all your symptoms are resolved and your STD testing is negative

## 2023-09-29 LAB — GC/CHLAMYDIA PROBE AMP (~~LOC~~) NOT AT ARMC
Chlamydia: NEGATIVE
Comment: NEGATIVE
Comment: NORMAL
Neisseria Gonorrhea: NEGATIVE

## 2023-10-01 ENCOUNTER — Encounter: Payer: Self-pay | Admitting: Obstetrics

## 2023-10-01 ENCOUNTER — Ambulatory Visit: Payer: BC Managed Care – PPO | Admitting: Obstetrics

## 2023-10-01 ENCOUNTER — Other Ambulatory Visit (HOSPITAL_COMMUNITY)
Admission: RE | Admit: 2023-10-01 | Discharge: 2023-10-01 | Disposition: A | Discharge status: Home / Self Care | Payer: BC Managed Care – PPO | Source: Ambulatory Visit | Attending: Obstetrics | Admitting: Obstetrics

## 2023-10-01 VITALS — BP 115/72 | HR 74 | Wt 187.0 lb

## 2023-10-01 DIAGNOSIS — B081 Molluscum contagiosum: Secondary | ICD-10-CM

## 2023-10-01 DIAGNOSIS — N898 Other specified noninflammatory disorders of vagina: Secondary | ICD-10-CM

## 2023-10-01 DIAGNOSIS — F419 Anxiety disorder, unspecified: Secondary | ICD-10-CM

## 2023-10-01 MED ORDER — ACYCLOVIR 5 % EX OINT: 1.0000 | TOPICAL_OINTMENT | Freq: Four times a day (QID) | CUTANEOUS | 0 refills | Status: DC

## 2023-10-01 MED ORDER — HYDROXYZINE PAMOATE 25 MG PO CAPS: 25.0000 mg | ORAL_CAPSULE | Freq: Three times a day (TID) | ORAL | 0 refills | Status: AC | PRN

## 2023-10-01 NOTE — Progress Notes (Unsigned)
Pt is in the office reporting yellow vaginal discharge, irritation, and yellow colored bumps on labia leading all the way to anus. Pt states that symptoms started last week and she went to the ED on 09/27/23 and had negative STD testing, however symptoms have gotten worse since then.

## 2023-10-01 NOTE — Progress Notes (Unsigned)
Patient ID: Yesenia Clarke, female   DOB: 2001/02/06, 22 y.o.   MRN: [id number]  Chief Complaint  Patient presents with   GYN    HPI Yesenia Clarke is a 22 y.o. female.  Complains of itchy bumps on buttocks for 1 week. HPI  Past Medical History:  Diagnosis Date   ADHD (attention deficit hyperactivity disorder)    Anxiety    Depression    Pregnancy induced hypertension    Shoulder dystocia, delivered 12/20/2021    Past Surgical History:  Procedure Laterality Date   FRACTURE SURGERY     WISDOM TOOTH EXTRACTION      Family History  Problem Relation Age of Onset   Drug abuse Father    Alcoholism Father    Hypertension Maternal Grandmother     Social History Social History   Tobacco Use   Smoking status: Never    Passive exposure: Never   Smokeless tobacco: Never  Vaping Use   Vaping status: Never Used  Substance Use Topics   Alcohol use: No   Drug use: No    No Known Allergies  No current outpatient medications on file.   No current facility-administered medications for this visit.    Review of Systems Review of Systems Constitutional: negative for fatigue and weight loss Respiratory: negative for cough and wheezing Cardiovascular: negative for chest pain, fatigue and palpitations Gastrointestinal: negative for abdominal pain and change in bowel habits Genitourinary: positive for bumps on buttocks that itch Integument/breast: negative for nipple discharge Musculoskeletal:negative for myalgias Neurological: negative for gait problems and tremors Behavioral/Psych: negative for abusive relationship, depression Endocrine: negative for temperature intolerance      Blood pressure 115/72, pulse 74, weight 187 lb (84.8 kg), last menstrual period 09/24/2023, not currently breastfeeding.  Physical Exam Physical Exam General:   Alert and no distress  Skin:   no rash or abnormalities  Lungs:   clear to auscultation bilaterally  Heart:   regular rate and rhythm, S1, S2  normal, no murmur, click, rub or gallop  Breasts:   normal without suspicious masses, skin or nipple changes or axillary nodes  Abdomen:  normal findings: no organomegaly, soft, non-tender and no hernia  Pelvis:  External genitalia: normal general appearance Urinary system: urethral meatus normal and bladder without fullness, nontender Vaginal: normal without tenderness, induration or masses Cervix: normal appearance Adnexa: normal bimanual exam Uterus: anteverted and non-tender, normal size    I have spent a total of 20 minutes of face-to-face and non-face-to-face time, excluding clinical staff time, reviewing notes and preparing to see patient, ordering tests and/or medications, and counseling the patient.   Data Reviewed Labs  Assessment     1. Molluscum contagiosum infection Rx: - acyclovir ointment (ZOVIRAX) 5 %; Apply 1 Application topically in the morning, at noon, in the evening, and at bedtime.  Dispense: 30 g; Refill: 0 - hydrOXYzine (VISTARIL) 25 MG capsule; Take 1 capsule (25 mg total) by mouth 3 (three) times daily as needed.  Dispense: 30 capsule; Refill: 0  2. Anxiety Rx: - hydrOXYzine (VISTARIL) 25 MG capsule; Take 1 capsule (25 mg total) by mouth 3 (three) times daily as needed.  Dispense: 30 capsule; Refill: 0  3. Vaginal discharge Rx: - Cervicovaginal ancillary only( [place])     Plan   Follow up in 4 weeks.  Brock Bad, MD, FACOG Attending Obstetrician & Gynecologist, [facility] for [facility], [facility], [place] 10/01/2023

## 2023-10-03 LAB — CERVICOVAGINAL ANCILLARY ONLY
Bacterial Vaginitis (gardnerella): NEGATIVE
Candida Glabrata: NEGATIVE
Candida Vaginitis: NEGATIVE
Chlamydia: NEGATIVE
Comment: NEGATIVE
Comment: NEGATIVE
Comment: NEGATIVE
Comment: NEGATIVE
Comment: NEGATIVE
Comment: NORMAL
Neisseria Gonorrhea: NEGATIVE
Trichomonas: NEGATIVE

## 2023-10-13 ENCOUNTER — Encounter: Payer: Self-pay | Admitting: Obstetrics

## 2023-10-13 ENCOUNTER — Encounter: Payer: Self-pay | Admitting: Advanced Practice Midwife

## 2023-10-14 ENCOUNTER — Other Ambulatory Visit: Payer: Self-pay | Admitting: Obstetrics

## 2023-10-14 DIAGNOSIS — B081 Molluscum contagiosum: Secondary | ICD-10-CM

## 2023-10-15 ENCOUNTER — Other Ambulatory Visit: Payer: Self-pay | Admitting: Obstetrics

## 2023-10-15 DIAGNOSIS — B081 Molluscum contagiosum: Secondary | ICD-10-CM

## 2023-10-21 ENCOUNTER — Other Ambulatory Visit: Payer: Self-pay | Admitting: Obstetrics

## 2023-10-21 DIAGNOSIS — B081 Molluscum contagiosum: Secondary | ICD-10-CM

## 2023-10-27 ENCOUNTER — Telehealth: Payer: BC Managed Care – PPO | Admitting: Obstetrics

## 2023-10-27 ENCOUNTER — Encounter: Payer: Self-pay | Admitting: Obstetrics

## 2023-10-27 DIAGNOSIS — B081 Molluscum contagiosum: Secondary | ICD-10-CM

## 2023-10-27 NOTE — Progress Notes (Signed)
S/w pt for virtual visit. Pt states that she has been taking hydroxyzine and symptoms have only improved a little bit because she has been unable to get the rx for acyclovir ointment. Advised that the office will research the issue and follow up.

## 2023-10-27 NOTE — Progress Notes (Signed)
Patient did not answer phone.  Brock Bad, MD, FACOG Attending Obstetrician & Gynecologist, [facility] for [facility], [facility], [place] 10/27/2023

## 2023-11-17 ENCOUNTER — Encounter: Payer: Self-pay | Admitting: Advanced Practice Midwife

## 2023-11-19 ENCOUNTER — Ambulatory Visit (INDEPENDENT_AMBULATORY_CARE_PROVIDER_SITE_OTHER): Payer: BC Managed Care – PPO | Admitting: Obstetrics and Gynecology

## 2023-11-19 ENCOUNTER — Encounter: Payer: Self-pay | Admitting: Obstetrics and Gynecology

## 2023-11-19 VITALS — BP 127/68 | HR 85 | Ht 65.0 in | Wt 191.0 lb

## 2023-11-19 DIAGNOSIS — N9089 Other specified noninflammatory disorders of vulva and perineum: Secondary | ICD-10-CM | POA: Diagnosis not present

## 2023-11-19 DIAGNOSIS — B081 Molluscum contagiosum: Secondary | ICD-10-CM

## 2023-11-19 MED ORDER — ACYCLOVIR 5 % EX OINT: 1.0000 | TOPICAL_OINTMENT | Freq: Four times a day (QID) | CUTANEOUS | 0 refills | Status: DC

## 2023-11-19 NOTE — Progress Notes (Signed)
 States has bumps on vulva and maybe vagina. Thinks contracted these from former partner. Has not medicated area.

## 2023-11-19 NOTE — Progress Notes (Signed)
   GYNECOLOGY PROGRESS NOTE  History:  23 y.o. G1P1001 presents to [facility]  office today for problem gyn visit. She reports bumps in the vulvar region, started around November and still present. Has not been able to pick up cream from last visit. Reports they are sometimes itchy. No discharge from lesion. Only reports burning if she has irritated the area.   The following portions of the patient's history were reviewed and updated as appropriate: allergies, current medications, past family history, past medical history, past social history, past surgical history and problem list. Last pap smear on 10/01/23 was normal  Health Maintenance Due  Topic Date Due   HPV VACCINES (1 - 3-dose series) Never done   COVID-19 Vaccine (1 - 2024-25 season) Never done     Review of Systems:  Pertinent items are noted in HPI.   Objective:  Physical Exam Blood pressure 127/68, pulse 85, height 5' 5 (1.651 m), weight 191 lb (86.6 kg), last menstrual period 11/15/2023. VS reviewed, nursing note reviewed,  Constitutional: well developed, well nourished, no distress HEENT: normocephalic CV: normal rate Pulm/chest wall: normal effort Breast Exam: deferred Abdomen: soft Neuro: alert and oriented x 3 Skin: warm, dry Psych: affect normal Pelvic exam: few skin color papular lesion with center umbilication One area mildly vesicular in appearance   Assessment & Plan:  1. Molluscum contagiosum infection  2. Vulvar lesion (Primary) Resent medication from previous md visit  Swab collected, discussed time time frame to heal  Can refer to derm if not resolving or worsening  Recently had std screening that were negative  - acyclovir  ointment (ZOVIRAX ) 5 %; Apply 1 Application topically in the morning, at noon, in the evening, and at bedtime.  Dispense: 30 g; Refill: 0 - Herpes simplex virus culture   Nidia Daring, FNP

## 2023-11-22 LAB — HERPES SIMPLEX VIRUS CULTURE

## 2023-11-30 ENCOUNTER — Emergency Department (HOSPITAL_BASED_OUTPATIENT_CLINIC_OR_DEPARTMENT_OTHER)
Admission: EM | Admit: 2023-11-30 | Discharge: 2023-11-30 | Disposition: A | Discharge status: Home / Self Care | Payer: Medicaid Other | Attending: Emergency Medicine | Admitting: Emergency Medicine

## 2023-11-30 ENCOUNTER — Emergency Department (HOSPITAL_BASED_OUTPATIENT_CLINIC_OR_DEPARTMENT_OTHER): Payer: Medicaid Other

## 2023-11-30 ENCOUNTER — Other Ambulatory Visit: Payer: Self-pay

## 2023-11-30 ENCOUNTER — Encounter (HOSPITAL_BASED_OUTPATIENT_CLINIC_OR_DEPARTMENT_OTHER): Payer: Self-pay | Admitting: Emergency Medicine

## 2023-11-30 DIAGNOSIS — R7401 Elevation of levels of liver transaminase levels: Secondary | ICD-10-CM | POA: Insufficient documentation

## 2023-11-30 DIAGNOSIS — R748 Abnormal levels of other serum enzymes: Secondary | ICD-10-CM | POA: Insufficient documentation

## 2023-11-30 DIAGNOSIS — R102 Pelvic and perineal pain: Secondary | ICD-10-CM | POA: Diagnosis present

## 2023-11-30 LAB — CBC WITH DIFFERENTIAL/PLATELET
Abs Immature Granulocytes: 0.01 10*3/uL (ref 0.00–0.07)
Basophils Absolute: 0 10*3/uL (ref 0.0–0.1)
Basophils Relative: 1 %
Eosinophils Absolute: 0.1 10*3/uL (ref 0.0–0.5)
Eosinophils Relative: 3 %
HCT: 36.3 % (ref 36.0–46.0)
Hemoglobin: 12.4 g/dL (ref 12.0–15.0)
Immature Granulocytes: 0 %
Lymphocytes Relative: 31 %
Lymphs Abs: 1 10*3/uL (ref 0.7–4.0)
MCH: 30.3 pg (ref 26.0–34.0)
MCHC: 34.2 g/dL (ref 30.0–36.0)
MCV: 88.8 fL (ref 80.0–100.0)
Monocytes Absolute: 0.5 10*3/uL (ref 0.1–1.0)
Monocytes Relative: 16 %
Neutro Abs: 1.6 10*3/uL — ABNORMAL LOW (ref 1.7–7.7)
Neutrophils Relative %: 49 %
Platelets: 199 10*3/uL (ref 150–400)
RBC: 4.09 MIL/uL (ref 3.87–5.11)
RDW: 12.8 % (ref 11.5–15.5)
WBC: 3.3 10*3/uL — ABNORMAL LOW (ref 4.0–10.5)
nRBC: 0 % (ref 0.0–0.2)

## 2023-11-30 LAB — COMPREHENSIVE METABOLIC PANEL
ALT: 67 U/L — ABNORMAL HIGH (ref 0–44)
AST: 33 U/L (ref 15–41)
Albumin: 4 g/dL (ref 3.5–5.0)
Alkaline Phosphatase: 135 U/L — ABNORMAL HIGH (ref 38–126)
Anion gap: 5 (ref 5–15)
BUN: 7 mg/dL (ref 6–20)
CO2: 25 mmol/L (ref 22–32)
Calcium: 9 mg/dL (ref 8.9–10.3)
Chloride: 103 mmol/L (ref 98–111)
Creatinine, Ser: 0.71 mg/dL (ref 0.44–1.00)
GFR, Estimated: >60 mL/min (ref >60)
Glucose, Bld: 91 mg/dL (ref 70–99)
Potassium: 3.6 mmol/L (ref 3.5–5.1)
Sodium: 133 mmol/L — ABNORMAL LOW (ref 135–145)
Total Bilirubin: 1.1 mg/dL (ref 0.0–1.2)
Total Protein: 7.1 g/dL (ref 6.5–8.1)

## 2023-11-30 LAB — URINALYSIS, ROUTINE W REFLEX MICROSCOPIC
Bilirubin Urine: NEGATIVE
Glucose, UA: NEGATIVE mg/dL
Hgb urine dipstick: NEGATIVE
Ketones, ur: NEGATIVE mg/dL
Leukocytes,Ua: NEGATIVE
Nitrite: NEGATIVE
Protein, ur: NEGATIVE mg/dL
Specific Gravity, Urine: 1.015 (ref 1.005–1.030)
pH: 5.5 (ref 5.0–8.0)

## 2023-11-30 LAB — PREGNANCY, URINE: Preg Test, Ur: NEGATIVE

## 2023-11-30 LAB — WET PREP, GENITAL
Clue Cells Wet Prep HPF POC: NONE SEEN
Sperm: NONE SEEN
Trich, Wet Prep: NONE SEEN
WBC, Wet Prep HPF POC: <10 (ref <10)
Yeast Wet Prep HPF POC: NONE SEEN

## 2023-11-30 NOTE — ED Provider Notes (Signed)
[place] EMERGENCY DEPARTMENT AT [facility] Provider Note   CSN: [id number] Arrival date & time: 11/30/23  0945     History  No chief complaint on file.   Yesenia Clarke is a 23 y.o. female.  This is a 23 year old female here today for intermittent cramping said that she woke up this morning more than usual.  Her OB/GYN had scheduled her to take an ultrasound, patient was unable to make it to that appointment.  She has not noticed anything that seems to make it worse.  It does not appear to be tied to menses.  Patient came in today because she woke up that was bothering her more than her usual.        Home Medications Prior to Admission medications   Medication Sig Start Date End Date Taking? Authorizing Provider  acyclovir ointment (ZOVIRAX) 5 % Apply 1 Application topically in the morning, at noon, in the evening, and at bedtime. 11/19/23   Sue Lush, FNP  hydrOXYzine (VISTARIL) 25 MG capsule Take 1 capsule (25 mg total) by mouth 3 (three) times daily as needed. 10/01/23   Brock Bad, MD      Allergies    Patient has no known allergies.    Review of Systems   Review of Systems  Physical Exam Updated Vital Signs BP 109/65 (BP Location: Left Arm)   Pulse 75   Temp 98.7 F (37.1 C) (Oral)   Resp 18   LMP 11/07/2023 (Exact Date)   SpO2 100%  Physical Exam Vitals and nursing note reviewed.  Cardiovascular:     Rate and Rhythm: Normal rate.  Pulmonary:     Effort: Pulmonary effort is normal.  Abdominal:     General: Abdomen is flat. There is no distension.     Palpations: Abdomen is soft.     Tenderness: There is no abdominal tenderness. There is no right CVA tenderness, left CVA tenderness or guarding.     Comments: No adnexal mass, no adnexal tenderness  Musculoskeletal:        General: Normal range of motion.  Neurological:     Mental Status: She is alert.  Psychiatric:        Mood and Affect: Mood normal.     ED Results / Procedures  / Treatments   Labs (all labs ordered are listed, but only abnormal results are displayed) Labs Reviewed  CBC WITH DIFFERENTIAL/PLATELET - Abnormal; Notable for the following components:      Result Value   WBC 3.3 (*)    Neutro Abs 1.6 (*)    All other components within normal limits  COMPREHENSIVE METABOLIC PANEL - Abnormal; Notable for the following components:   Sodium 133 (*)    ALT 67 (*)    Alkaline Phosphatase 135 (*)    All other components within normal limits  WET PREP, GENITAL  PREGNANCY, URINE  URINALYSIS, ROUTINE W REFLEX MICROSCOPIC  GC/CHLAMYDIA PROBE AMP ([place]) NOT AT [facility]    EKG None  Radiology US Abdomen Limited RUQ (LIVER/GB) Result Date: 11/30/2023 CLINICAL DATA:  Elevated ALT. EXAM: ULTRASOUND ABDOMEN LIMITED RIGHT UPPER QUADRANT COMPARISON:  None Available. FINDINGS: Gallbladder: No gallstones or wall thickening visualized. No sonographic Murphy sign noted by sonographer. Common bile duct: Diameter: 2 mm Liver: No focal lesion identified. Within normal limits in parenchymal echogenicity. Portal vein is patent on color Doppler imaging with normal direction of blood flow towards the liver. Other: None. IMPRESSION: No gallstones or ductal dilatation Electronically Signed  By: Karen Kays M.D.   On: 11/30/2023 12:28   US PELVIC COMPLETE W TRANSVAGINAL AND TORSION R/O Result Date: 11/30/2023 CLINICAL DATA:  Generalized intermittent pelvic pain 1 year becoming more constant over the past month. EXAM: TRANSABDOMINAL AND TRANSVAGINAL ULTRASOUND OF PELVIS DOPPLER ULTRASOUND OF OVARIES TECHNIQUE: Both transabdominal and transvaginal ultrasound examinations of the pelvis were performed. Transabdominal technique was performed for global imaging of the pelvis including uterus, ovaries, adnexal regions, and pelvic cul-de-sac. It was necessary to proceed with endovaginal exam following the transabdominal exam to visualize the endometrium and ovaries. Color and duplex  Doppler ultrasound was utilized to evaluate blood flow to the ovaries. COMPARISON:  02/10/2023, 04/15/2022 FINDINGS: Uterus Measurements: 6.0 x 4.8 x 5.1 cm = volume: 77 ML. No fibroids or other mass visualized. Several nabothian cysts are present. Endometrium Thickness: 12-17 mm.  No focal abnormality visualized. Right ovary Measurements: 3.0 x 1.9 x 2.2 cm = volume: 6.7 mL. Normal appearance/no adnexal mass. Left ovary Measurements: 4.0 x 2.2 x 2.0 cm = volume: 9.4 mL. 6 mm indeterminate echogenic focus unchanged and likely benign. 2.1 cm dominant follicle. No follow-up imaging is recommended. Pulsed Doppler evaluation of both ovaries demonstrates normal low-resistance arterial and venous waveforms. Other findings Small amount of free pelvic fluid. IMPRESSION: 1. No acute findings. 2. Unremarkable uterus and ovaries. 3. Small amount of free pelvic fluid likely physiologic. Electronically Signed   By: Elberta Fortis M.D.   On: 11/30/2023 11:24    Procedures Procedures    Medications Ordered in ED Medications - No data to display  ED Course/ Medical Decision Making/ A&P                                 Medical Decision Making 23 year old female here today with worsening of chronic pelvic pain.  Differential diagnoses include chronic pelvic pain, pelvic congestion, endometriosis, ovarian cyst, less likely ovarian torsion, less likely appendicitis, less likely TOA, less likely ectopic pregnancy.  Plan-with patient symptoms ongoing for 1 year, lower suspicion for acute process.  Urine pregnancy ordered, urinalysis ordered.  Will the patient self swab for BV.  No vaginal discharge or bleeding.  Will obtain trans vaginal/transabdominal ultrasound.  Basic labs ordered.  Reassessment 1 PM-patient's pelvic ultrasound negative.  Patient did have elevated alk phos, mildly elevated ALT.  Did obtain right upper quadrant ultrasound which was normal.  Discussed these findings with the patient, she is going to  follow-up with her OB/GYN.  Not having any pain at this time.  Amount and/or Complexity of Data Reviewed Labs: ordered. Radiology: ordered.           Final Clinical Impression(s) / ED Diagnoses Final diagnoses:  Pelvic pain    Rx / DC Orders ED Discharge Orders     None         Arletha Pili, DO 11/30/23 1306

## 2023-11-30 NOTE — Discharge Instructions (Signed)
While you were in the emergency room you had an ultrasound done that was normal.  We also did an ultrasound of your gallbladder that was also normal.  Your blood work showed just mild elevations in the lab called your alkaline phosphatase, that sometimes can be elevated if there is problems with the gallbladder, but sometimes can just be elevated for normal reasons.  Please follow-up with your OB/GYN.  Return to the emergency department if develop sudden worsening abdominal pain.

## 2023-11-30 NOTE — ED Triage Notes (Signed)
Pt has been having cramps in her pelvis intermittently for a year. She has had a cyst on her ovary in the past. Her ob had her scheduled for Ultrasound but pt did not go. Pain has worsened and is now is constant.no difficulty voiding, no fevers

## 2023-12-01 LAB — GC/CHLAMYDIA PROBE AMP (~~LOC~~) NOT AT ARMC
Chlamydia: NEGATIVE
Comment: NEGATIVE
Comment: NORMAL
Neisseria Gonorrhea: NEGATIVE

## 2023-12-21 ENCOUNTER — Other Ambulatory Visit: Payer: Self-pay | Admitting: Obstetrics and Gynecology

## 2023-12-21 DIAGNOSIS — B081 Molluscum contagiosum: Secondary | ICD-10-CM

## 2023-12-21 MED ORDER — ACYCLOVIR 5 % EX OINT: 1.0000 | TOPICAL_OINTMENT | Freq: Four times a day (QID) | CUTANEOUS | 0 refills | Status: DC

## 2024-02-05 ENCOUNTER — Encounter: Payer: Self-pay | Admitting: Advanced Practice Midwife

## 2024-02-18 ENCOUNTER — Other Ambulatory Visit: Payer: Self-pay

## 2024-02-18 ENCOUNTER — Encounter: Payer: Self-pay | Admitting: Advanced Practice Midwife

## 2024-02-18 DIAGNOSIS — B081 Molluscum contagiosum: Secondary | ICD-10-CM

## 2024-02-18 MED ORDER — ACYCLOVIR 5 % EX OINT: 1.0000 | TOPICAL_OINTMENT | Freq: Four times a day (QID) | CUTANEOUS | 0 refills | Status: DC

## 2024-02-18 NOTE — Progress Notes (Signed)
 Acyclovir ointment rx refill sent for pt outbreak

## 2024-05-25 ENCOUNTER — Other Ambulatory Visit (HOSPITAL_COMMUNITY)
Admission: RE | Admit: 2024-05-25 | Discharge: 2024-05-25 | Disposition: A | Discharge status: Home / Self Care | Source: Ambulatory Visit | Attending: Obstetrics | Admitting: Obstetrics

## 2024-05-25 ENCOUNTER — Ambulatory Visit (INDEPENDENT_AMBULATORY_CARE_PROVIDER_SITE_OTHER)

## 2024-05-25 VITALS — BP 131/77 | HR 79

## 2024-05-25 DIAGNOSIS — Z113 Encounter for screening for infections with a predominantly sexual mode of transmission: Secondary | ICD-10-CM | POA: Diagnosis present

## 2024-05-25 NOTE — Progress Notes (Signed)
 SUBJECTIVE:  23 y.o. female who desires a STI screen. Denies abnormal vaginal discharge, bleeding or significant pelvic pain. No UTI symptoms. Denies history of known exposure to STD.  No LMP recorded.  OBJECTIVE:  She appears well.   ASSESSMENT:  STI Screen   PLAN:  Pt offered STI blood screening-not indicated GC, chlamydia, and trichomonas probe sent to lab.  Treatment: To be determined once lab results are received.  Pt follow up as needed.

## 2024-05-26 ENCOUNTER — Ambulatory Visit: Payer: Self-pay | Admitting: Obstetrics and Gynecology

## 2024-05-26 LAB — CERVICOVAGINAL ANCILLARY ONLY
Bacterial Vaginitis (gardnerella): POSITIVE — AB
Candida Glabrata: NEGATIVE
Candida Vaginitis: NEGATIVE
Chlamydia: NEGATIVE
Comment: NEGATIVE
Comment: NEGATIVE
Comment: NEGATIVE
Comment: NEGATIVE
Comment: NEGATIVE
Comment: NORMAL
Neisseria Gonorrhea: NEGATIVE
Trichomonas: NEGATIVE

## 2024-05-27 ENCOUNTER — Other Ambulatory Visit: Payer: Self-pay

## 2024-05-27 MED ORDER — METRONIDAZOLE 500 MG PO TABS: 500.0000 mg | ORAL_TABLET | Freq: Two times a day (BID) | ORAL | 0 refills | Status: AC

## 2024-06-08 ENCOUNTER — Emergency Department (HOSPITAL_BASED_OUTPATIENT_CLINIC_OR_DEPARTMENT_OTHER)
Admission: EM | Admit: 2024-06-08 | Discharge: 2024-06-08 | Disposition: A | Discharge status: Home / Self Care | Attending: Emergency Medicine | Admitting: Emergency Medicine

## 2024-06-08 ENCOUNTER — Other Ambulatory Visit: Payer: Self-pay

## 2024-06-08 ENCOUNTER — Encounter (HOSPITAL_BASED_OUTPATIENT_CLINIC_OR_DEPARTMENT_OTHER): Payer: Self-pay

## 2024-06-08 DIAGNOSIS — K0889 Other specified disorders of teeth and supporting structures: Secondary | ICD-10-CM | POA: Diagnosis present

## 2024-06-08 MED ORDER — HYDROCODONE-ACETAMINOPHEN 5-325 MG PO TABS: 1.0000 | ORAL_TABLET | Freq: Four times a day (QID) | ORAL | 0 refills | Status: AC | PRN

## 2024-06-08 NOTE — ED Triage Notes (Signed)
 Pt c/o likely dental pain, hx cracked tooth/ cavity in same area. Seen by urgent tooth, follow up w regular dentist in sept  Ibuprofen  800 approx 4p

## 2024-06-08 NOTE — Discharge Instructions (Signed)
 Please use Tylenol or ibuprofen for pain.  You may use 600 mg ibuprofen every 6 hours or 1000 mg of Tylenol every 6 hours.  You may choose to alternate between the 2.  This would be most effective.  Not to exceed 4 g of Tylenol within 24 hours.  Not to exceed 3200 mg ibuprofen 24 hours.  You can use the stronger narcotic pain medication in place of Tylenol for severe break through pain.  If you take the narcotic pain medication that we prescribed recommend that you also take a laxative such as MiraLAX or Dulcolax every day that you take the narcotic pain medicine, and drink plenty of fluids, 50 to 64 ounces to prevent any constipation.

## 2024-06-08 NOTE — ED Provider Notes (Signed)
 [place] EMERGENCY DEPARTMENT AT [facility] Provider Note   CSN: [id number] Arrival date & time: 06/08/24  8167     Patient presents with: Dental Pain   Yesenia Clarke is a 23 y.o. female with overall noncontributory past medical history presents concern for left upper dental pain intermittently for several weeks.  She has a history of a cracked tooth.  She saw the dental urgent care, has been taking amoxicillin, 800 mg ibuprofen , Tylenol , reports that the pain is getting worse.  She reports that she is post to see a dentist in a couple of weeks but that she is going to try to get into see them sooner if possible.  She denies any fever, chills.    Dental Pain      Prior to Admission medications   Medication Sig Start Date End Date Taking? Authorizing Provider  HYDROcodone -acetaminophen  (NORCO/VICODIN) 5-325 MG tablet Take 1 tablet by mouth every 6 (six) hours as needed. 06/08/24  Yes Atavia Poppe H, PA-C  acyclovir  ointment (ZOVIRAX ) 5 % Apply 1 Application topically in the morning, at noon, in the evening, and at bedtime. 02/18/24   Abigail Rollo DASEN, MD  hydrOXYzine  (VISTARIL ) 25 MG capsule Take 1 capsule (25 mg total) by mouth 3 (three) times daily as needed. 10/01/23   Rudy Carlin LABOR, MD  metroNIDAZOLE  (FLAGYL ) 500 MG tablet Take 1 tablet (500 mg total) by mouth 2 (two) times daily. 05/27/24   Erik Kieth BROCKS, MD    Allergies: Patient has no known allergies.    Review of Systems  All other systems reviewed and are negative.   Updated Vital Signs BP 137/81   Pulse 77   Temp 98.4 F (36.9 C)   Resp 16   LMP 04/22/2024 (Approximate)   SpO2 100%   Physical Exam Vitals and nursing note reviewed.  Constitutional:      General: She is not in acute distress.    Appearance: Normal appearance.  HENT:     Head: Normocephalic and atraumatic.     Mouth/Throat:     Comments: No significant posterior oropharynx erythema, swelling, exudate. Uvula midline,  tonsils 1+ bilaterally.  No trismus, stridor, evidence of PTA, floor of mouth swelling or redness.   Does have singular cracked tooth on upper left without surrounding redness, erythema, or soft tissue swelling. Eyes:     General:        Right eye: No discharge.        Left eye: No discharge.  Cardiovascular:     Rate and Rhythm: Normal rate and regular rhythm.  Pulmonary:     Effort: Pulmonary effort is normal. No respiratory distress.  Musculoskeletal:        General: No deformity.  Skin:    General: Skin is warm and dry.  Neurological:     Mental Status: She is alert and oriented to person, place, and time.  Psychiatric:        Mood and Affect: Mood normal.        Behavior: Behavior normal.     (all labs ordered are listed, but only abnormal results are displayed) Labs Reviewed - No data to display  EKG: None  Radiology: No results found.   Procedures   Medications Ordered in the ED - No data to display                                  Medical Decision  Making   This an overall well-appearing 23 y.o. female who presents with concern for dental pain.  Physical exam reveals  broken teeth in mouth on upper left.  Patient with redness, gum swelling without evidence of gum abscess, periapical abscess, PTA, uvula deviation, pharyngitis, epiglottitis, dysphonia, stridor.  Patient without difficulty swallowing.  No systemic fever, chills.  Already taking antibiotics which I feel is appropriate.  Encouraged ibuprofen , Tylenol , Orajel, ice for pain control. Considered stronger pain control with short course of Norco, given that she is already taking antibiotics, and other over-the-counter pain medications as prescribed I think reasonable to give her a short course of pain medicine at this time, discussed I recommend urgent dental follow-up, dental resource guide provided.  Provided Augmentin for antibiotic coverage.  Patient discharged in stable condition at this time, return  precautions given.  Final diagnoses:  Pain, dental    ED Discharge Orders          Ordered    HYDROcodone -acetaminophen  (NORCO/VICODIN) 5-325 MG tablet  Every 6 hours PRN        06/08/24 1926               Purvis Sidle H, PA-C 06/08/24 1934    Cottie Donnice PARAS, MD 06/08/24 1950

## 2024-06-28 ENCOUNTER — Other Ambulatory Visit: Payer: Self-pay

## 2024-06-28 MED ORDER — FLUCONAZOLE 150 MG PO TABS
150.0000 mg | ORAL_TABLET | Freq: Once | ORAL | 0 refills | Status: AC
Start: 2024-06-28 — End: 2024-06-28

## 2024-10-21 ENCOUNTER — Other Ambulatory Visit: Payer: Self-pay

## 2024-10-21 ENCOUNTER — Encounter: Payer: Self-pay | Admitting: Advanced Practice Midwife

## 2024-10-21 DIAGNOSIS — B081 Molluscum contagiosum: Secondary | ICD-10-CM
